# Patient Record
Sex: Female | Born: 1995 | Race: Black or African American | Hispanic: No | Marital: Single | Smoking: Current some day smoker
Health system: Southern US, Community
[De-identification: ages and names within clinical notes are randomized; demographics above are authoritative.]

## PROBLEM LIST (undated history)

## (undated) DIAGNOSIS — E663 Overweight: Secondary | ICD-10-CM

## (undated) DIAGNOSIS — J45909 Unspecified asthma, uncomplicated: Secondary | ICD-10-CM

## (undated) DIAGNOSIS — L509 Urticaria, unspecified: Secondary | ICD-10-CM

## (undated) HISTORY — DX: Overweight: E66.3

## (undated) HISTORY — DX: Urticaria, unspecified: L50.9

## (undated) HISTORY — DX: Unspecified asthma, uncomplicated: J45.909

## (undated) HISTORY — PX: ADENOIDECTOMY: SUR15

---

## 2017-02-02 ENCOUNTER — Encounter (HOSPITAL_COMMUNITY): Payer: Self-pay | Admitting: Emergency Medicine

## 2017-02-02 ENCOUNTER — Emergency Department (HOSPITAL_COMMUNITY)
Admission: EM | Admit: 2017-02-02 | Discharge: 2017-02-02 | Disposition: A | Discharge status: Home / Self Care | Payer: No Typology Code available for payment source | Attending: Emergency Medicine | Admitting: Emergency Medicine

## 2017-02-02 DIAGNOSIS — K5900 Constipation, unspecified: Secondary | ICD-10-CM | POA: Insufficient documentation

## 2017-02-02 LAB — URINALYSIS, ROUTINE W REFLEX MICROSCOPIC
BILIRUBIN URINE: NEGATIVE
Bacteria, UA: NONE SEEN
GLUCOSE, UA: NEGATIVE mg/dL
Hgb urine dipstick: NEGATIVE
KETONES UR: 5 mg/dL — AB
Leukocytes, UA: NEGATIVE
NITRITE: NEGATIVE
PH: 6 (ref 5.0–8.0)
Protein, ur: NEGATIVE mg/dL
SPECIFIC GRAVITY, URINE: 1.012 (ref 1.005–1.030)

## 2017-02-02 LAB — POC URINE PREG, ED: Preg Test, Ur: NEGATIVE

## 2017-02-02 MED ORDER — SORBITOL 70 % SOLN
960.0000 mL | TOPICAL_OIL | Freq: Once | ORAL | Status: AC
  Administered 2017-02-02: 960 mL via RECTAL
  Filled 2017-02-02: qty 240

## 2017-02-02 MED ORDER — ONDANSETRON 4 MG PO TBDP
4.0000 mg | ORAL_TABLET | Freq: Once | ORAL | Status: AC
  Administered 2017-02-02: 4 mg via ORAL
  Filled 2017-02-02: qty 1

## 2017-02-02 MED ORDER — DICYCLOMINE HCL 10 MG PO CAPS
20.0000 mg | ORAL_CAPSULE | Freq: Once | ORAL | Status: AC
Start: 2017-02-02 — End: 2017-02-02
  Administered 2017-02-02: 20 mg via ORAL
  Filled 2017-02-02: qty 2

## 2017-02-02 NOTE — ED Notes (Signed)
Pt reports that she has not had a bowel movement since Friday 01/27/17. Pt reports a couple of small bowel movements but nothing significant. Pt also reports intermittent lower abd pains.

## 2017-02-02 NOTE — Discharge Instructions (Signed)
I recommend that you increase your water and fiber intake. If you are not able to eat foods high in fiber, you may use Benefiber or Metamucil over-the-counter. I also recommend you use MiraLAX 1-2 times a day and Colace 100 mg twice a day to help with bowel movements. These medications are over the counter.  You may use other over-the-counter medications such as Dulcolax, Fleet enemas, magnesium citrate as needed for constipation. Please note that some of these medications may cause you to have abdominal cramping which is normal. If you develop severe abdominal pain, fever, vomiting, distention of your abdomen, unable to have a bowel movement for 5 days or are not passing gas, please return to the hospital. ° °

## 2017-02-02 NOTE — ED Provider Notes (Signed)
By signing my name below, I, Vista Mink, attest that this documentation has been prepared under the direction and in the presence of Haynes Giannotti, Layla Maw, DO. Electronically signed, Vista Mink, ED Scribe. 02/02/17. 4:15 AM.  TIME SEEN: 3:17 AM  CHIEF COMPLAINT: Constipation  HPI:  HPI Comments: Shelby Burke is a 21 y.o. female who presents to the Emergency Department complaining of persistent constipation that started today around 1300. Pt has felt like she's needed to have a bowel movement since this afternoon. When attempting to do so she states that she would start shaking and sweating. She also reports associated mild, intermittent lower abdominal cramping when trying to have a BM. Pt further reports intermittent nausea but no vomiting. Her last BM was six days ago. She is still having flatulence. Pt used Gentle women's laxative from [employer] once yesterday. No other medications tried prior to arrival. LNMP last week. No Hx of constipation. No Hx of abdominal surgeries. No abnormal vaginal discharge, bleeding. No dysuria, hematuria. No vomiting. No fever. Is having rectal pain but no rectal bleeding.   ROS: See HPI Constitutional: no fever  Eyes: no drainage  ENT: no runny nose   Cardiovascular:  no chest pain  Resp: no SOB  GI: no vomiting GU: no dysuria Integumentary: no rash  Allergy: no hives  Musculoskeletal: no leg swelling  Neurological: no slurred speech ROS otherwise negative  PAST MEDICAL HISTORY/PAST SURGICAL HISTORY:  History reviewed. No pertinent past medical history.  MEDICATIONS:  Prior to Admission medications   Not on File    ALLERGIES:  Not on File  SOCIAL HISTORY:  Social History  Substance Use Topics  . Smoking status: Never Smoker  . Smokeless tobacco: Never Used  . Alcohol use No    FAMILY HISTORY: History reviewed. No pertinent family history.  EXAM: BP (!) 132/97 (BP Location: Right Arm)   Pulse 63   Temp 98 F (36.7 C) (Oral)   Resp 18    Ht 5\' 4"  (1.626 m)   Wt 180 lb (81.6 kg)   LMP 01/15/2017   SpO2 100%   BMI 30.90 kg/m  CONSTITUTIONAL: Alert and oriented and responds appropriately to questions. Well-appearing; well-nourished HEAD: Normocephalic EYES: Conjunctivae clear, pupils appear equal, EOMI ENT: normal nose; moist mucous membranes NECK: Supple, no meningismus, no nuchal rigidity, no LAD  CARD: RRR; S1 and S2 appreciated; no murmurs, no clicks, no rubs, no gallops RESP: Normal chest excursion without splinting or tachypnea; breath sounds clear and equal bilaterally; no wheezes, no rhonchi, no rales, no hypoxia or respiratory distress, speaking full sentences ABD/GI: Normal bowel sounds; non-distended; soft, non-tender, no rebound, no guarding, no peritoneal signs, no hepatosplenomegaly RECTAL:  Normal rectal tone, no gross blood or melena, no hemorrhoids appreciated, nontender rectal exam, patient has hard stool in the rectal vault BACK:  The back appears normal and is non-tender to palpation, there is no CVA tenderness EXT: Normal ROM in all joints; non-tender to palpation; no edema; normal capillary refill; no cyanosis, no calf tenderness or swelling    SKIN: Normal color for age and race; warm; no rash NEURO: Moves all extremities equally PSYCH: The patient's mood and manner are appropriate. Grooming and personal hygiene are appropriate.  MEDICAL DECISION MAKING: Patient here with constipation since Friday. Abdominal exam benign. Doubt bowel obstruction. We'll give her an enema. No rectal bleeding on exam. No vomiting. She is otherwise well-appearing. We'll treat symptomatically with Bentyl and Zofran. Will obtain urine pregnancy test.  ED PROGRESS: 5:30 AM  Pt has been able to have a large bowel movement and reports feeling better. Urine and urine pregnancy test pending.  7:00 AM  Pt's urine shows no sign of infection and pregnancy test is negative. She reports complete relief in symptoms. Has been able to  have multiple large bowel movements. No vomiting here. Abdominal exam is still benign. No rectal bleeding. I feel she is safe to be discharged home. Recommend increased water and fiber intake at home. Given instructions on what to do at home if she has return of constipation. Discussed return precautions.   At this time, I do not feel there is any life-threatening condition present. I have reviewed and discussed all results (EKG, imaging, lab, urine as appropriate) and exam findings with patient/family. I have reviewed nursing notes and appropriate previous records.  I feel the patient is safe to be discharged home without further emergent workup and can continue workup as an outpatient as needed. Discussed usual and customary return precautions. Patient/family verbalize understanding and are comfortable with this plan.  Outpatient follow-up has been provided if needed. All questions have been answered.  This chart was scribed in my presence and reviewed by me personally.    Corlene Sabia, Layla MawKristen N, DO 02/02/17 0700

## 2017-02-02 NOTE — ED Triage Notes (Signed)
Pt presents to ED for assessment of constipation since 1pm today.  Pt states she has been pushing very hard and is now experiencing severe rectal pain.  Denies bleeding.

## 2017-07-18 HISTORY — PX: BREAST REDUCTION SURGERY: SHX8

## 2018-06-12 ENCOUNTER — Encounter: Payer: Self-pay | Admitting: Emergency Medicine

## 2018-06-12 ENCOUNTER — Emergency Department (HOSPITAL_COMMUNITY)
Admission: EM | Admit: 2018-06-12 | Discharge: 2018-06-12 | Disposition: A | Discharge status: Home / Self Care | Payer: No Typology Code available for payment source | Attending: Emergency Medicine | Admitting: Emergency Medicine

## 2018-06-12 DIAGNOSIS — J0100 Acute maxillary sinusitis, unspecified: Secondary | ICD-10-CM

## 2018-06-12 DIAGNOSIS — M542 Cervicalgia: Secondary | ICD-10-CM | POA: Diagnosis not present

## 2018-06-12 DIAGNOSIS — J019 Acute sinusitis, unspecified: Secondary | ICD-10-CM | POA: Insufficient documentation

## 2018-06-12 MED ORDER — AMOXICILLIN-POT CLAVULANATE 875-125 MG PO TABS
1.0000 | ORAL_TABLET | Freq: Two times a day (BID) | ORAL | 0 refills | Status: DC
Start: 2018-06-12 — End: 2018-06-19

## 2018-06-12 NOTE — ED Triage Notes (Signed)
Pt presents for evaluation of 2 day hx of headache, sore throat and cough. Pt reports some neck pain and chills.

## 2018-06-12 NOTE — ED Provider Notes (Signed)
[facility] EMERGENCY DEPARTMENT Provider Note   CSN: [id number] Arrival date & time: 06/12/18  1310     History   Chief Complaint Chief Complaint  Patient presents with  . Neck Pain  . Cough    HPI Parthena Fergeson is a 22 y.o. female.  22 y.o female with no PMH presents to the ED with a chief complaint of headache, neck pain x 2 days. Patient reports having some head pain, sore throat and congestion over the weekend and now worsening to neck pain. She is currently a Consulting civil engineer at [employer] and does not live in a dorm but in an apartment with two roommates.  Patient reports seen [facility] the student [facility] center this morning and had a strep test, influenza test, flu test and they were all negative.  She was sent in by student health center for suspicion of meningitis.  She does report she has had a cough over the weekend which she has taken some Mucinex with relieving symptoms.  Reports headache mainly located on the left side and frontal region along with occipital region and some photophobia. Denies any nausea, vomiting, abdominal pain, chest pain or shortness of breath.     History reviewed. No pertinent past medical history.  There are no active problems to display for this patient.   History reviewed. No pertinent surgical history.   OB History   None      Home Medications    Prior to Admission medications   Medication Sig Start Date End Date Taking? Authorizing Provider  amoxicillin-clavulanate (AUGMENTIN) 875-125 MG tablet Take 1 tablet by mouth 2 (two) times daily for 7 days. 06/12/18 06/19/18  Claude Manges, PA-C    Family History No family history on file.  Social History Social History   Tobacco Use  . Smoking status: Never Smoker  . Smokeless tobacco: Never Used  Substance Use Topics  . Alcohol use: No  . Drug use: No     Allergies   Patient has no known allergies.   Review of Systems Review of Systems    Constitutional: Negative for fever.  HENT: Positive for rhinorrhea and sore throat.   Respiratory: Positive for cough.   Cardiovascular: Negative for chest pain.  Gastrointestinal: Negative for abdominal pain.  Genitourinary: Negative for dysuria and flank pain.  Musculoskeletal: Positive for neck pain.  Skin: Negative for pallor and wound.  Neurological: Positive for headaches.     Physical Exam Updated Vital Signs BP 130/88   Pulse 81   Temp 98.9 F (37.2 C) (Oral)   Resp 16   LMP 06/12/2018 (Exact Date)   SpO2 98%   Physical Exam  Constitutional: She appears well-developed and well-nourished.  HENT:  Head: Normocephalic and atraumatic.  Nose: Right sinus exhibits maxillary sinus tenderness. Right sinus exhibits no frontal sinus tenderness. Left sinus exhibits maxillary sinus tenderness and frontal sinus tenderness.  Neck: Normal range of motion. Neck supple. Spinous process tenderness and muscular tenderness present. No neck rigidity. Normal range of motion present. No Brudzinski's sign and no Kernig's sign noted.  Nursing note and vitals reviewed.    ED Treatments / Results  Labs (all labs ordered are listed, but only abnormal results are displayed) Labs Reviewed - No data to display  EKG None  Radiology No results found.  Procedures Procedures (including critical care time)  Medications Ordered in ED Medications - No data to display   Initial Impression / Assessment and Plan / ED Course  I have reviewed the triage vital signs and the nursing notes.  Pertinent labs & imaging results that were available during my care of the patient were reviewed by me and considered in my medical decision making (see chart for details).   Patient presents with neck pain and headache x 2 days. Afebrile with normal vital signs during evaluation playing on her cellphone. [facility] and had strep, flu, mono test performed which were all  negative. Sent here to r/o "meningitis with CT imaging".  Relation patient has no neck rigidity but does complaint of headache along with neck pain, her neurologic exam is reassuring. Low suspicion for meningitis, patient is well appearing.  Patient does have tenderness along the left maxillary and frontal sinus, she is likely suffering from sinusitis.  We will send patient home on prescription for antibiotics.  According to patient goal was told that she did not have her last angitis vaccine, patient states that she is had her vaccines are up-to-date. Return precautions provided.   Final Clinical Impressions(s) / ED Diagnoses   Final diagnoses:  Neck pain  Acute maxillary sinusitis, recurrence not specified    ED Discharge Orders         Ordered    amoxicillin-clavulanate (AUGMENTIN) 875-125 MG tablet  2 times daily     06/12/18 1428           Claude MangesSoto, Illyana Schorsch, PA-C 06/12/18 1436    Melene PlanFloyd, Dan, DO 06/12/18 1439

## 2018-06-12 NOTE — Discharge Instructions (Addendum)
I have prescribed antibiotics to treat your sinus infection, please take 1 tablet twice a day for the next 7 days. Follow up with your primary care physician as needed.

## 2018-06-19 ENCOUNTER — Emergency Department (HOSPITAL_COMMUNITY)
Admission: EM | Admit: 2018-06-19 | Discharge: 2018-06-19 | Disposition: A | Discharge status: Home / Self Care | Payer: No Typology Code available for payment source | Attending: Emergency Medicine | Admitting: Emergency Medicine

## 2018-06-19 ENCOUNTER — Emergency Department (HOSPITAL_COMMUNITY): Payer: No Typology Code available for payment source

## 2018-06-19 DIAGNOSIS — J0100 Acute maxillary sinusitis, unspecified: Secondary | ICD-10-CM | POA: Diagnosis not present

## 2018-06-19 DIAGNOSIS — R51 Headache: Secondary | ICD-10-CM | POA: Diagnosis present

## 2018-06-19 LAB — PREGNANCY, URINE: PREG TEST UR: NEGATIVE

## 2018-06-19 MED ORDER — FLUTICASONE PROPIONATE 50 MCG/ACT NA SUSP: 1.0000 | Freq: Every day | NASAL | 2 refills | Status: AC

## 2018-06-19 MED ORDER — DEXAMETHASONE SODIUM PHOSPHATE 10 MG/ML IJ SOLN
10.0000 mg | Freq: Once | INTRAMUSCULAR | Status: AC
  Administered 2018-06-19: 10 mg via INTRAMUSCULAR
  Filled 2018-06-19: qty 1

## 2018-06-19 MED ORDER — KETOROLAC TROMETHAMINE 30 MG/ML IJ SOLN
30.0000 mg | Freq: Once | INTRAMUSCULAR | Status: AC
  Administered 2018-06-19: 30 mg via INTRAMUSCULAR
  Filled 2018-06-19: qty 1

## 2018-06-19 MED ORDER — KETOROLAC TROMETHAMINE 30 MG/ML IJ SOLN: 30.0000 mg | Freq: Once | INTRAMUSCULAR | Status: DC

## 2018-06-19 MED ORDER — DOXYCYCLINE HYCLATE 100 MG PO CAPS: 100.0000 mg | ORAL_CAPSULE | Freq: Two times a day (BID) | ORAL | 0 refills | Status: DC

## 2018-06-19 NOTE — ED Notes (Signed)
Patient transported to CT 

## 2018-06-19 NOTE — ED Triage Notes (Signed)
Pt reports headache intermittently X2 weeks. Was seen for same and diagnosed with sinusitis. She has had persistent symptoms and was sent by student health center for further evaluation.

## 2018-06-19 NOTE — ED Provider Notes (Signed)
[facility] Provider Note   CSN: [id number] Arrival date & time: 06/19/18  1341     History   Chief Complaint Chief Complaint  Patient presents with  . Headache    HPI Shelby Burke is a 22 y.o. female.  22 year old female presents with complaint of headache.  Patient states that she was seen in the ER 1 week ago for headache and neck pain, sent by student health, diagnosed with sinusitis in the ER and treated with Augmentin.  Patient has completed the Augmentin, reports she continues to have a frontal and left temporal headache.  Pain is constant, nothing makes pain better, nothing makes pain any worse, denies relief with Motrin and Tylenol at home.  Patient reports mild cough, denies congestion, fevers, neck pain or stiffness.  Patient is otherwise healthy, immunizations are up-to-date, has history of previous headaches like this in the past.  No other complaints or concerns.     No past medical history on file.  There are no active problems to display for this patient.   No past surgical history on file.   OB History   None      Home Medications    Prior to Admission medications   Medication Sig Start Date End Date Taking? Authorizing Provider  doxycycline (VIBRAMYCIN) 100 MG capsule Take 1 capsule (100 mg total) by mouth 2 (two) times daily. 06/19/18   Jeannie FendMurphy,  A, PA-C  fluticasone (FLONASE) 50 MCG/ACT nasal spray Place 1 spray into both nostrils daily. 06/19/18   Jeannie FendMurphy,  A, PA-C    Family History No family history on file.  Social History Social History   Tobacco Use  . Smoking status: Never Smoker  . Smokeless tobacco: Never Used  Substance Use Topics  . Alcohol use: No  . Drug use: No     Allergies   Patient has no known allergies.   Review of Systems Review of Systems  Constitutional: Negative for chills and fever.  HENT: Negative for congestion, rhinorrhea, sinus pressure, sinus pain,  sneezing and sore throat.   Eyes: Negative for photophobia and visual disturbance.  Respiratory: Positive for cough.   Gastrointestinal: Negative for nausea and vomiting.  Musculoskeletal: Negative for neck pain and neck stiffness.  Skin: Negative for rash and wound.  Allergic/Immunologic: Negative for immunocompromised state.  Neurological: Positive for headaches. Negative for dizziness, facial asymmetry, speech difficulty and weakness.  Hematological: Negative for adenopathy.  Psychiatric/Behavioral: Negative for confusion.  All other systems reviewed and are negative.    Physical Exam Updated Vital Signs BP 118/81 (BP Location: Right Arm)   Pulse 68   Temp 98.9 F (37.2 C) (Oral)   Resp 16   LMP 06/12/2018 (Exact Date)   SpO2 100%   Physical Exam  Constitutional: She is oriented to person, place, and time. She appears well-developed and well-nourished.  Non-toxic appearance. She does not appear ill. No distress.  HENT:  Head: Normocephalic and atraumatic.  Mouth/Throat: Oropharynx is clear and moist.  Eyes: Pupils are equal, round, and reactive to light. EOM are normal.  Neck: Normal range of motion. Neck supple. No neck rigidity. No Brudzinski's sign and no Kernig's sign noted.  Cardiovascular: Normal rate, regular rhythm, normal heart sounds and intact distal pulses.  Pulmonary/Chest: Effort normal and breath sounds normal. No respiratory distress.  Musculoskeletal: She exhibits tenderness.       Back:  Lymphadenopathy:    She has no cervical adenopathy.  Neurological: She is alert and oriented  to person, place, and time. She has normal strength. She displays normal reflexes. No cranial nerve deficit or sensory deficit. GCS eye subscore is 4. GCS verbal subscore is 5. GCS motor subscore is 6.  Skin: Skin is warm and dry. She is not diaphoretic.  Psychiatric: She has a normal mood and affect. Her behavior is normal.  Nursing note and vitals reviewed.    ED Treatments  / Results  Labs (all labs ordered are listed, but only abnormal results are displayed) Labs Reviewed  PREGNANCY, URINE    EKG None  Radiology Ct Head Wo Contrast  Result Date: 06/19/2018 CLINICAL DATA:  Headache. EXAM: CT HEAD WITHOUT CONTRAST TECHNIQUE: Contiguous axial images were obtained from the base of the skull through the vertex without intravenous contrast. COMPARISON:  None. FINDINGS: Brain: No evidence of acute infarction, hemorrhage, hydrocephalus, extra-axial collection or mass lesion/mass effect. Vascular: No hyperdense vessel or unexpected calcification. Skull: Normal. Negative for fracture or focal lesion. Sinuses/Orbits: Mucosal thickening in the right greater than left maxillary sinus. Opacification of the left sphenoid sinus. Other: None. IMPRESSION: 1. Sinus disease as above. 2. No acute intracranial abnormalities. Electronically Signed   By: Gerome Sam III M.D   On: 06/19/2018 16:23    Procedures Procedures (including critical care time)  Medications Ordered in ED Medications  dexamethasone (DECADRON) injection 10 mg (has no administration in time range)  ketorolac (TORADOL) 30 MG/ML injection 30 mg (has no administration in time range)     Initial Impression / Assessment and Plan / ED Course  I have reviewed the triage vital signs and the nursing notes.  Pertinent labs & imaging results that were available during my care of the patient were reviewed by me and considered in my medical decision making (see chart for details).  Clinical Course as of Jun 20 1647  Tue Jun 19, 2018  576 22 year old female returns to the ER with ongoing headache.  Patient was in the emergency room 1 week ago and diagnosed with acute sinusitis, treated with 7 days of Augmentin.  Patient has completed the course of antibiotics, continues to have headaches.  Exam is unremarkable, neuro exam normal.  CT head for headaches in a patient with no prior history of headaches shows  sinusitis.  Patient will be started on doxycycline, she has completed her Augmentin.  Also recommend Flonase, Zyrtec, Sudafed, saline sinus rinse.  Patient lives in [place], she is attending school locally.  Recommend patient follow-up with her PCP, referral to ENT if not improving.  Discharge planning discussed with patient and with her parents with patient's permission, patient and parents verbalized understanding of discharge instructions and plan.   [LM]    Clinical Course User Index [LM] Jeannie Fend, PA-C   Final Clinical Impressions(s) / ED Diagnoses   Final diagnoses:  Acute non-recurrent maxillary sinusitis    ED Discharge Orders         Ordered    doxycycline (VIBRAMYCIN) 100 MG capsule  2 times daily     06/19/18 1636    fluticasone (FLONASE) 50 MCG/ACT nasal spray  Daily     06/19/18 1636           Jeannie Fend, PA-C 06/19/18 1648    Curatolo, Adam, DO 06/19/18 1830

## 2018-06-19 NOTE — ED Notes (Signed)
Patient verbalizes understanding of discharge instructions. Opportunity for questioning and answers were provided. Armband removed by staff, pt discharged from ED ambulatory.   

## 2018-06-19 NOTE — Discharge Instructions (Signed)
Take doxycycline as prescribed and complete the full course. Use Flonase daily as prescribed.   Use saline sinus rinse twice daily. Take Zyrtec daily as directed. Take Sudafed daily (ask the pharmacist for this medication, this is NOT the same Sudafed that is available over the counter).  Follow up with PCP for recheck, referral to ENT if not improving.

## 2018-06-19 NOTE — ED Notes (Signed)
Got patient into a gown on the monitor patient is resting with call ell in reach

## 2019-03-16 ENCOUNTER — Other Ambulatory Visit: Payer: Self-pay

## 2019-03-16 DIAGNOSIS — Z20822 Contact with and (suspected) exposure to covid-19: Secondary | ICD-10-CM

## 2019-03-17 LAB — NOVEL CORONAVIRUS, NAA: SARS-CoV-2, NAA: NOT DETECTED

## 2019-07-29 ENCOUNTER — Telehealth: Payer: Self-pay

## 2019-07-29 NOTE — Telephone Encounter (Signed)
Received forms for patient to transfer her allergy injections from [facility] to our office. Patient has not been seen in our office. Called and spoke with patient in regards to this matter. Patient stated that she is wanting to transfer her injections to our office and she was not sure how to go about doing that. Informed patient that she would need to be seen by one of our providers before we can agree to administer her injections.Patient scheduled an appt for 07/31/2019 at 2 pm with Dr. Selena Batten. Forms will be signed and faxed back during that time.

## 2019-07-31 ENCOUNTER — Encounter: Payer: Self-pay | Admitting: Allergy

## 2019-07-31 ENCOUNTER — Ambulatory Visit: Payer: No Typology Code available for payment source | Admitting: Allergy

## 2019-07-31 ENCOUNTER — Other Ambulatory Visit: Payer: Self-pay

## 2019-07-31 VITALS — BP 122/80 | HR 66 | Temp 97.2°F | Resp 18 | Ht 64.5 in | Wt 193.6 lb

## 2019-07-31 DIAGNOSIS — J3089 Other allergic rhinitis: Secondary | ICD-10-CM

## 2019-07-31 DIAGNOSIS — J329 Chronic sinusitis, unspecified: Secondary | ICD-10-CM

## 2019-07-31 DIAGNOSIS — J4520 Mild intermittent asthma, uncomplicated: Secondary | ICD-10-CM | POA: Diagnosis not present

## 2019-07-31 DIAGNOSIS — L282 Other prurigo: Secondary | ICD-10-CM | POA: Diagnosis not present

## 2019-07-31 MED ORDER — EPINEPHRINE 0.3 MG/0.3ML IJ SOAJ: 0.3000 mg | Freq: Once | INTRAMUSCULAR | 1 refills | Status: AC

## 2019-07-31 NOTE — Progress Notes (Signed)
New Patient Note  RE: Golda Zavalza MRN: [id number] DOB: Jan 01, 1996 Date of Office Visit: 07/31/2019  Referring provider: Physicians, Neil Crouch* Primary care provider: Physicians, [facility]  Chief Complaint: Allergic Rhinitis  and Urticaria  History of Present Illness: I had the pleasure of seeing Tasheika Kitzmiller for initial evaluation at the [facility] on 07/31/2019. She is a 24 y.o. female, who is self-referred here for the evaluation of transfer of allergy injections.   Rhinitis: She reports symptoms of sneezing, nasal congestion, rhinorrhea, frequent sinus infections. Symptoms have been going on for 20+ years. The symptoms are present all year around with worsening in spring and winter. Other triggers include exposure to pets. Anosmia: no. Headache: sometimes. She has used Xyzal, Flonase 2 sprays per nostril BID with some improvement in symptoms. No nosebleeds. Sinus infections: yes - about 5. Previous work up includes: 07/16/2019 which was positive to cat, dog, cockroaches, tree, grass, mold per patient report. Patient is from [place] area but goes to school in [place]. The office in [place] made the vial and will ship to [place]. Has not had her first injection yet.  Previous ENT evaluation: adenoidectomy in 2011. Previous sinus imaging: no. History of nasal polyps: no.  Last eye exam: in 2020. History of reflux: no.  Rash: Rash started about 4 years ago. Mainly occurs on her arms and legs. Describes them as itchy, red, blotchiness. Individual rashes lasts about a few hours. No ecchymosis upon resolution. Associated symptoms include: none. Suspected triggers are unknown. Denies any fevers, chills, changes in medications, foods, personal care products or recent infections. She has tried the following therapies: Xyzal BID with no benefit. Systemic steroids - Currently on prednisone taper and doing well on it.  Previous work up includes: had some type of  bloodwork. Previous history of rash/hives: no.  Reviewed images on the phone - faint erythematous patch on upper extremities.   Asthma: She reports symptoms of chest tightness, shortness of breath, coughing, wheezing for 15 years. Current medications include albuterol prn which help. She reports not using aerochamber with inhalers. She tried the following inhalers: none. Main triggers are exertion, pollen. In the last month, frequency of symptoms: <1x/week. Frequency of nocturnal symptoms: 0x/month. Frequency of SABA use: <1x/week. Interference with physical activity: sometimes. Sleep is undisturbed. In the last 12 months, emergency room visits/urgent care visits/doctor office visits or hospitalizations due to respiratory issues: 0. In the last 12 months, oral steroids courses: 0. Lifetime history of hospitalization for respiratory issues: no. Prior intubations: no. Asthma was diagnosed at age 45. History of pneumonia: age 7. She was evaluated by allergist in the past. Smoking exposure: no. Up to date with flu vaccine: yes.   Assessment and Plan: Sharalee is a 24 y.o. female with: Other allergic rhinitis Perennial rhinitis symptoms for 20+ years with worsening in the spring and winter. Currently on Xyzal and Flonase with some benefit. Skin testing on 07/16/2019 at outside facility was positive to cat, dog, cockroaches, tree, grass, mold per patient report - in process of obtaining records. Patient goes to school in [place] and would like to receive AIT in our office but use her previous allergist's vials.   Will review results/notes from your previous allergist.  Please call before your first injection to make sure your vials are here.   Discussed in detail regarding the dosing, schedule, side effects (mild to moderate local allergic reaction and rarely systemic allergic reactions including anaphylaxis), and benefits (significant improvement in nasal  symptoms, seasonal flares of asthma) of immunotherapy  with the patient. There is significant time commitment involved with allergy shots.   I have prescribed epinephrine injectable and demonstrated proper use. For mild symptoms you can take over the counter antihistamines such as Benadryl and monitor symptoms closely. If symptoms worsen or if you have severe symptoms including breathing issues, throat closure, significant swelling, whole body hives, severe diarrhea and vomiting, lightheadedness then inject epinephrine and seek immediate medical care afterwards.  Consent was signed.   Continue environmental control measures.  Continue Xyzal 5mg  daily at night.  Continue Flonase 2 sprays per nostril once a day. Continue Singulair (montelukast) 10mg  daily at night. Keep track of sinus infections.   Frequent sinus infections 5 sinus infections this year. Apparently she had immune evaluation as well - in process of obtaining records.  Keep track of infections.   Pruritic rash Pruritic rash on extremities for the past 4 years. Worse after showers sometimes. Takes Xyzal 5mg  BID with minimal benefit. Currently on a prednisone taper. Apparently she had bloodwork for this as well - in process of obtaining records.   Finish prednisone taper as prescribed by your other allergist.   See below for proper skin care.  Mild intermittent asthma without complication Diagnosed with asthma over 15 years ago. Using albuterol less than once a month. Main triggers are exertion.   May use albuterol rescue inhaler 2 puffs every 4 to 6 hours as needed for shortness of breath, chest tightness, coughing, and wheezing. May use albuterol rescue inhaler 2 puffs 5 to 15 minutes prior to strenuous physical activities. Monitor frequency of use.   Return in about 6 months (around 01/28/2020).  Meds ordered this encounter  Medications  . EPINEPHrine 0.3 mg/0.3 mL IJ SOAJ injection    Sig: Inject 0.3 mLs (0.3 mg total) into the muscle once for 1 dose.    Dispense:  2  each    Refill:  1    Generic Mylan Brand   Other allergy screening: Food allergy: no Medication allergy: no Hymenoptera allergy: no Eczema:no History of recurrent infections suggestive of immunodeficency: no  Diagnostics: None.   Past Medical History: Patient Active Problem List   Diagnosis Date Noted  . Other allergic rhinitis 07/31/2019  . Frequent sinus infections 07/31/2019  . Pruritic rash 07/31/2019  . Mild intermittent asthma without complication 07/31/2019   Past Medical History:  Diagnosis Date  . Asthma   . Urticaria    Past Surgical History: Past Surgical History:  Procedure Laterality Date  . ADENOIDECTOMY    . BREAST REDUCTION SURGERY  2019   Medication List:  Current Outpatient Medications  Medication Sig Dispense Refill  . albuterol (VENTOLIN HFA) 108 (90 Base) MCG/ACT inhaler SMARTSIG:2 Puff(s) By Mouth Every 4-6 Hours PRN    . fluticasone (FLONASE) 50 MCG/ACT nasal spray Place 1 spray into both nostrils daily. 16 g 2  . levocetirizine (XYZAL) 5 MG tablet Take 5 mg by mouth daily.    . montelukast (SINGULAIR) 10 MG tablet Take 10 mg by mouth daily.    . predniSONE (DELTASONE) 10 MG tablet TAKE 6 TABLETS DAILY X 3 DAYS THEN DECREASE BY 1 TABLET EVERY 3 DAYS UNTIL FINISHED    . TRI-LO-MARZIA 0.18/0.215/0.25 MG-25 MCG tab Take 1 tablet by mouth daily.    08/02/2019 doxycycline (VIBRAMYCIN) 100 MG capsule Take 1 capsule (100 mg total) by mouth 2 (two) times daily. (Patient not taking: Reported on 07/31/2019) 20 capsule 0  . EPINEPHrine 0.3  mg/0.3 mL IJ SOAJ injection Inject 0.3 mLs (0.3 mg total) into the muscle once for 1 dose. 2 each 1   No current facility-administered medications for this visit.   Allergies: No Known Allergies Social History: Social History   Socioeconomic History  . Marital status: Single    Spouse name: Not on file  . Number of children: Not on file  . Years of education: Not on file  . Highest education level: Not on file   Occupational History  . Not on file  Tobacco Use  . Smoking status: Never Smoker  . Smokeless tobacco: Never Used  Substance and Sexual Activity  . Alcohol use: Yes    Comment: once or twice a month  . Drug use: No  . Sexual activity: Not on file  Other Topics Concern  . Not on file  Social History Narrative  . Not on file   Social Determinants of Health   Financial Resource Strain:   . Difficulty of Paying Living Expenses: Not on file  Food Insecurity:   . Worried About Programme researcher, broadcasting/film/video in the Last Year: Not on file  . Ran Out of Food in the Last Year: Not on file  Transportation Needs:   . Lack of Transportation (Medical): Not on file  . Lack of Transportation (Non-Medical): Not on file  Physical Activity:   . Days of Exercise per Week: Not on file  . Minutes of Exercise per Session: Not on file  Stress:   . Feeling of Stress : Not on file  Social Connections:   . Frequency of Communication with Friends and Family: Not on file  . Frequency of Social Gatherings with Friends and Family: Not on file  . Attends Religious Services: Not on file  . Active Member of Clubs or Organizations: Not on file  . Attends Banker Meetings: Not on file  . Marital Status: Not on file   Lives in an apartment. Smoking: denies Occupation: full time Press photographer HistorySurveyor, minerals in the house: no Carpet in the family room: no Carpet in the bedroom: no Heating: electric Cooling: central Pet: no  Family History: Family History  Problem Relation Age of Onset  . Asthma Mother   . Allergic rhinitis Mother   . Allergic rhinitis Father    Review of Systems  Constitutional: Negative for appetite change, chills, fever and unexpected weight change.  HENT: Positive for congestion, rhinorrhea and sneezing.   Eyes: Negative for itching.  Respiratory: Negative for cough, chest tightness, shortness of breath and wheezing.   Cardiovascular: Negative for  chest pain.  Gastrointestinal: Negative for abdominal pain.  Genitourinary: Negative for difficulty urinating.  Skin: Negative for rash.  Allergic/Immunologic: Positive for environmental allergies. Negative for food allergies.  Neurological: Negative for headaches.   Objective: BP 122/80 (BP Location: Left Arm, Patient Position: Sitting, Cuff Size: Normal)   Pulse 66   Temp (!) 97.2 F (36.2 C) (Temporal)   Resp 18   Ht 5' 4.5" (1.638 m)   Wt 193 lb 9.6 oz (87.8 kg)   SpO2 97%   BMI 32.72 kg/m  Body mass index is 32.72 kg/m. Physical Exam  Constitutional: She is oriented to person, place, and time. She appears well-developed and well-nourished.  HENT:  Head: Normocephalic and atraumatic.  Right Ear: External ear normal.  Left Ear: External ear normal.  Nose: Nose normal.  Mouth/Throat: Oropharynx is clear and moist.  Eyes: Conjunctivae and EOM are normal.  Cardiovascular:  Normal rate, regular rhythm and normal heart sounds. Exam reveals no gallop and no friction rub.  No murmur heard. Pulmonary/Chest: Effort normal and breath sounds normal. She has no wheezes. She has no rales.  Abdominal: Soft.  Musculoskeletal:     Cervical back: Neck supple.  Neurological: She is alert and oriented to person, place, and time.  Skin: Skin is warm. No rash noted.  Psychiatric: She has a normal mood and affect. Her behavior is normal.  Nursing note and vitals reviewed.  The plan was reviewed with the patient/family, and all questions/concerned were addressed.  It was my pleasure to see Rhenda today and participate in her care. Please feel free to contact me with any questions or concerns.  Sincerely,  Rexene Alberts, DO Allergy & Immunology  [facility] office: [phone] [facility] office: [place] office: [phone]

## 2019-07-31 NOTE — Assessment & Plan Note (Signed)
Pruritic rash on extremities for the past 4 years. Worse after showers sometimes. Takes Xyzal 5mg  BID with minimal benefit. Currently on a prednisone taper. Apparently she had bloodwork for this as well - in process of obtaining records.   Finish prednisone taper as prescribed by your other allergist.   See below for proper skin care.

## 2019-07-31 NOTE — Telephone Encounter (Signed)
Patient was seen in clinic today by Dr. Selena Batten. Dr. Selena Batten has signed the forms and is also requesting patients medical records, both have been faxed to [facility]. Waiting for medical records to be faxed and patient vials to be shipped to our [place] office.

## 2019-07-31 NOTE — Assessment & Plan Note (Signed)
5 sinus infections this year. Apparently she had immune evaluation as well - in process of obtaining records.  Keep track of infections.

## 2019-07-31 NOTE — Patient Instructions (Addendum)
Will get results/notes from your previous allergist.   Once, we have the vials from your previous allergist, we will administer them at our office.   Please call before your first injection to make sure your vials are here.   I have prescribed epinephrine injectable and demonstrated proper use. For mild symptoms you can take over the counter antihistamines such as Benadryl and monitor symptoms closely. If symptoms worsen or if you have severe symptoms including breathing issues, throat closure, significant swelling, whole body hives, severe diarrhea and vomiting, lightheadedness then inject epinephrine and seek immediate medical care afterwards.  Consent was signed.    Continue environmental control measures.  Continue xyzal 5mg  daily at night.  Continue Flonase 2 sprays per nostril once a day. Continue Singulair (montelukast) 10mg  daily at night. Keep track of sinus infections.   Asthma:  May use albuterol rescue inhaler 2 puffs every 4 to 6 hours as needed for shortness of breath, chest tightness, coughing, and wheezing. May use albuterol rescue inhaler 2 puffs 5 to 15 minutes prior to strenuous physical activities. Monitor frequency of use.   Rash:   Finish prednisone taper as prescribed by your other allergist.   See below for proper skin care.  Follow up in 6 months or sooner if needed.  Make 2 week appointment for injection.    Skin care recommendations  Bath time: . Always use lukewarm water. AVOID very hot or cold water. Keep bathing time to 5-10 minutes. . Do NOT use bubble bath. . Use a mild soap and use just enough to wash the dirty areas. . Do NOT scrub skin vigorously.  . After bathing, pat dry your skin with a towel. Do NOT rub or scrub the skin.  Moisturizers and prescriptions:  . ALWAYS apply moisturizers immediately after bathing (within 3 minutes). This helps to lock-in moisture. . Use the moisturizer several times a day over the whole body.  summer moisturizers include: Aveeno, CeraVe, Cetaphil. Marland Kitchen winter moisturizers include: Aquaphor, Vaseline, Cerave, Cetaphil, Eucerin, Vanicream. . When using moisturizers along with medications, the moisturizer should be applied about one hour after applying the medication to prevent diluting effect of the medication or moisturize around where you applied the medications. When not using medications, the moisturizer can be continued twice daily as maintenance.  Laundry and clothing: . Avoid laundry products with added color or perfumes. . Use unscented hypo-allergenic laundry products such as Tide free, Cheer free & gentle, and All free and clear.  . If the skin still seems dry or sensitive, you can try double-rinsing the clothes. . Avoid tight or scratchy clothing such as wool. . Do not use fabric softeners or dyer sheets.

## 2019-07-31 NOTE — Assessment & Plan Note (Signed)
Diagnosed with asthma over 15 years ago. Using albuterol less than once a month. Main triggers are exertion.   May use albuterol rescue inhaler 2 puffs every 4 to 6 hours as needed for shortness of breath, chest tightness, coughing, and wheezing. May use albuterol rescue inhaler 2 puffs 5 to 15 minutes prior to strenuous physical activities. Monitor frequency of use.

## 2019-07-31 NOTE — Assessment & Plan Note (Addendum)
Perennial rhinitis symptoms for 20+ years with worsening in the spring and winter. Currently on Xyzal and Flonase with some benefit. Skin testing on 07/16/2019 at outside facility was positive to cat, dog, cockroaches, tree, grass, mold per patient report - in process of obtaining records. Patient goes to school in [place] and would like to receive AIT in our office but use her previous allergist's vials.   Will review results/notes from your previous allergist.  Please call before your first injection to make sure your vials are here.   Discussed in detail regarding the dosing, schedule, side effects (mild to moderate local allergic reaction and rarely systemic allergic reactions including anaphylaxis), and benefits (significant improvement in nasal symptoms, seasonal flares of asthma) of immunotherapy with the patient. There is significant time commitment involved with allergy shots.   I have prescribed epinephrine injectable and demonstrated proper use. For mild symptoms you can take over the counter antihistamines such as Benadryl and monitor symptoms closely. If symptoms worsen or if you have severe symptoms including breathing issues, throat closure, significant swelling, whole body hives, severe diarrhea and vomiting, lightheadedness then inject epinephrine and seek immediate medical care afterwards.  Consent was signed.   Continue environmental control measures.  Continue Xyzal 5mg  daily at night.  Continue Flonase 2 sprays per nostril once a day. Continue Singulair (montelukast) 10mg  daily at night. Keep track of sinus infections.

## 2019-08-08 NOTE — Telephone Encounter (Signed)
Received fax from [facility] stating patient's vials had been mailed and that we should receive them 08-09-2019.

## 2019-08-09 NOTE — Telephone Encounter (Signed)
Vials have been received in the [place] office.

## 2019-08-09 NOTE — Telephone Encounter (Signed)
Patient's silver vials are her first vials to start injections with and have been placed in the new start tray. The remainder of the patient's vials have been placed in the section for Outside Office vials. Patient's shot records and refill records have been placed in the outdside office records binder.

## 2019-08-15 ENCOUNTER — Telehealth: Payer: Self-pay

## 2019-08-15 NOTE — Telephone Encounter (Signed)
I spoke with [facility] and they are faxing records over to the [place] location and [facility] location

## 2019-08-15 NOTE — Telephone Encounter (Signed)
Records were received and given to Dr. Selena Batten for review.

## 2019-08-15 NOTE — Telephone Encounter (Signed)
-----   Message from Ellamae Sia, DO sent at 08/14/2019  1:28 PM EST ----- Regarding: FW: awaiting clt allergy notes and vials Can someone follow up on her previous allergist's records. Requested at last OV but did not receive yet.  Her vials are here but I need to review her previous allergist's notes.   Thank you.  ----- Message ----- From: Ellamae Sia, DO Sent: 07/31/2019   4:10 PM EST To: Ellamae Sia, DO Subject: awaiting clt allergy notes and vials

## 2019-08-16 NOTE — Telephone Encounter (Signed)
Reviewed notes from previous allergist's visit from 07/16/2019. Skin prick testing was positive to cat, dog, dust mites, cockroach, elder tree, ash tree, beech tree, birch tree, Box Elder, Cottonwood, American elm, red mulberry, oak mix, black walnut, [place] grass, [place], Johnson, grass mix #7, cocklebur, lambs quarter, mugwort, pigweed, [place] plantin, Engineer, agricultural, ragweed mix, Alternaria, aspergillus, Hormodendrum.  Records sent for scanning.

## 2019-08-21 ENCOUNTER — Other Ambulatory Visit: Payer: Self-pay

## 2019-08-21 ENCOUNTER — Ambulatory Visit (INDEPENDENT_AMBULATORY_CARE_PROVIDER_SITE_OTHER): Payer: No Typology Code available for payment source

## 2019-08-21 DIAGNOSIS — J309 Allergic rhinitis, unspecified: Secondary | ICD-10-CM | POA: Diagnosis not present

## 2019-08-21 NOTE — Progress Notes (Signed)
Immunotherapy   Patient Details  Name: Nasiya Pascual MRN: [id number] Date of Birth: 08/15/1995  08/21/2019  Lisbeth Renshaw started her allergy injections from another office today. Patient received 0.1 ml out of vial A and vial B both silver 1:10000. Patient waited 30 minutes with no problems.  Following schedule:  C Frequency: Weekly Epi-Pen: Yes Consent signed and patient instructions given.   Dub Mikes 08/21/2019, 9:59 AM

## 2019-08-28 ENCOUNTER — Ambulatory Visit (INDEPENDENT_AMBULATORY_CARE_PROVIDER_SITE_OTHER): Payer: No Typology Code available for payment source

## 2019-08-28 DIAGNOSIS — J309 Allergic rhinitis, unspecified: Secondary | ICD-10-CM

## 2019-09-04 ENCOUNTER — Ambulatory Visit (INDEPENDENT_AMBULATORY_CARE_PROVIDER_SITE_OTHER): Payer: No Typology Code available for payment source

## 2019-09-04 DIAGNOSIS — J309 Allergic rhinitis, unspecified: Secondary | ICD-10-CM | POA: Diagnosis not present

## 2019-09-11 ENCOUNTER — Ambulatory Visit (INDEPENDENT_AMBULATORY_CARE_PROVIDER_SITE_OTHER): Payer: No Typology Code available for payment source | Admitting: *Deleted

## 2019-09-11 DIAGNOSIS — J309 Allergic rhinitis, unspecified: Secondary | ICD-10-CM

## 2019-09-18 ENCOUNTER — Ambulatory Visit (INDEPENDENT_AMBULATORY_CARE_PROVIDER_SITE_OTHER): Payer: No Typology Code available for payment source

## 2019-09-18 DIAGNOSIS — J309 Allergic rhinitis, unspecified: Secondary | ICD-10-CM

## 2019-09-25 ENCOUNTER — Ambulatory Visit (INDEPENDENT_AMBULATORY_CARE_PROVIDER_SITE_OTHER): Payer: No Typology Code available for payment source | Admitting: *Deleted

## 2019-09-25 DIAGNOSIS — J309 Allergic rhinitis, unspecified: Secondary | ICD-10-CM

## 2019-10-03 ENCOUNTER — Ambulatory Visit (INDEPENDENT_AMBULATORY_CARE_PROVIDER_SITE_OTHER): Payer: No Typology Code available for payment source

## 2019-10-03 DIAGNOSIS — J309 Allergic rhinitis, unspecified: Secondary | ICD-10-CM

## 2019-10-09 ENCOUNTER — Ambulatory Visit (INDEPENDENT_AMBULATORY_CARE_PROVIDER_SITE_OTHER): Payer: No Typology Code available for payment source

## 2019-10-09 DIAGNOSIS — J309 Allergic rhinitis, unspecified: Secondary | ICD-10-CM | POA: Diagnosis not present

## 2019-10-16 ENCOUNTER — Ambulatory Visit (INDEPENDENT_AMBULATORY_CARE_PROVIDER_SITE_OTHER): Payer: No Typology Code available for payment source

## 2019-10-16 DIAGNOSIS — J309 Allergic rhinitis, unspecified: Secondary | ICD-10-CM

## 2019-10-23 ENCOUNTER — Ambulatory Visit (INDEPENDENT_AMBULATORY_CARE_PROVIDER_SITE_OTHER): Payer: No Typology Code available for payment source

## 2019-10-23 DIAGNOSIS — J309 Allergic rhinitis, unspecified: Secondary | ICD-10-CM

## 2019-10-30 ENCOUNTER — Ambulatory Visit (INDEPENDENT_AMBULATORY_CARE_PROVIDER_SITE_OTHER): Payer: No Typology Code available for payment source

## 2019-10-30 DIAGNOSIS — J309 Allergic rhinitis, unspecified: Secondary | ICD-10-CM

## 2019-11-06 ENCOUNTER — Ambulatory Visit (INDEPENDENT_AMBULATORY_CARE_PROVIDER_SITE_OTHER): Payer: No Typology Code available for payment source

## 2019-11-06 DIAGNOSIS — J309 Allergic rhinitis, unspecified: Secondary | ICD-10-CM | POA: Diagnosis not present

## 2019-11-13 ENCOUNTER — Ambulatory Visit (INDEPENDENT_AMBULATORY_CARE_PROVIDER_SITE_OTHER): Payer: No Typology Code available for payment source

## 2019-11-13 DIAGNOSIS — J309 Allergic rhinitis, unspecified: Secondary | ICD-10-CM

## 2019-11-22 ENCOUNTER — Ambulatory Visit (INDEPENDENT_AMBULATORY_CARE_PROVIDER_SITE_OTHER): Payer: No Typology Code available for payment source

## 2019-11-22 DIAGNOSIS — J309 Allergic rhinitis, unspecified: Secondary | ICD-10-CM

## 2019-11-29 ENCOUNTER — Ambulatory Visit (INDEPENDENT_AMBULATORY_CARE_PROVIDER_SITE_OTHER): Payer: No Typology Code available for payment source

## 2019-11-29 DIAGNOSIS — J309 Allergic rhinitis, unspecified: Secondary | ICD-10-CM | POA: Diagnosis not present

## 2019-12-05 ENCOUNTER — Ambulatory Visit (INDEPENDENT_AMBULATORY_CARE_PROVIDER_SITE_OTHER): Payer: No Typology Code available for payment source

## 2019-12-05 DIAGNOSIS — J309 Allergic rhinitis, unspecified: Secondary | ICD-10-CM | POA: Diagnosis not present

## 2019-12-13 ENCOUNTER — Ambulatory Visit (INDEPENDENT_AMBULATORY_CARE_PROVIDER_SITE_OTHER): Payer: No Typology Code available for payment source

## 2019-12-13 DIAGNOSIS — J309 Allergic rhinitis, unspecified: Secondary | ICD-10-CM

## 2019-12-20 ENCOUNTER — Ambulatory Visit (INDEPENDENT_AMBULATORY_CARE_PROVIDER_SITE_OTHER): Payer: No Typology Code available for payment source

## 2019-12-20 DIAGNOSIS — J309 Allergic rhinitis, unspecified: Secondary | ICD-10-CM | POA: Diagnosis not present

## 2019-12-31 ENCOUNTER — Ambulatory Visit (INDEPENDENT_AMBULATORY_CARE_PROVIDER_SITE_OTHER): Payer: No Typology Code available for payment source

## 2019-12-31 DIAGNOSIS — J309 Allergic rhinitis, unspecified: Secondary | ICD-10-CM | POA: Diagnosis not present

## 2020-01-09 ENCOUNTER — Ambulatory Visit (INDEPENDENT_AMBULATORY_CARE_PROVIDER_SITE_OTHER): Payer: No Typology Code available for payment source

## 2020-01-09 DIAGNOSIS — J309 Allergic rhinitis, unspecified: Secondary | ICD-10-CM | POA: Diagnosis not present

## 2020-01-21 ENCOUNTER — Ambulatory Visit (INDEPENDENT_AMBULATORY_CARE_PROVIDER_SITE_OTHER): Payer: No Typology Code available for payment source

## 2020-01-21 DIAGNOSIS — J309 Allergic rhinitis, unspecified: Secondary | ICD-10-CM | POA: Diagnosis not present

## 2020-01-29 ENCOUNTER — Emergency Department (HOSPITAL_COMMUNITY)
Admission: EM | Admit: 2020-01-29 | Discharge: 2020-01-29 | Disposition: A | Discharge status: Home / Self Care | Payer: No Typology Code available for payment source | Attending: Emergency Medicine | Admitting: Emergency Medicine

## 2020-01-29 ENCOUNTER — Encounter (HOSPITAL_COMMUNITY): Payer: Self-pay | Admitting: Emergency Medicine

## 2020-01-29 DIAGNOSIS — S61411A Laceration without foreign body of right hand, initial encounter: Secondary | ICD-10-CM | POA: Insufficient documentation

## 2020-01-29 DIAGNOSIS — J45909 Unspecified asthma, uncomplicated: Secondary | ICD-10-CM | POA: Insufficient documentation

## 2020-01-29 DIAGNOSIS — Y939 Activity, unspecified: Secondary | ICD-10-CM | POA: Diagnosis not present

## 2020-01-29 DIAGNOSIS — Y999 Unspecified external cause status: Secondary | ICD-10-CM | POA: Insufficient documentation

## 2020-01-29 DIAGNOSIS — Z23 Encounter for immunization: Secondary | ICD-10-CM | POA: Insufficient documentation

## 2020-01-29 DIAGNOSIS — Z79899 Other long term (current) drug therapy: Secondary | ICD-10-CM | POA: Diagnosis not present

## 2020-01-29 DIAGNOSIS — Y929 Unspecified place or not applicable: Secondary | ICD-10-CM | POA: Diagnosis not present

## 2020-01-29 DIAGNOSIS — W260XXA Contact with knife, initial encounter: Secondary | ICD-10-CM | POA: Diagnosis not present

## 2020-01-29 MED ORDER — TETANUS-DIPHTH-ACELL PERTUSSIS 5-2.5-18.5 LF-MCG/0.5 IM SUSP
0.5000 mL | Freq: Once | INTRAMUSCULAR | Status: AC
  Administered 2020-01-29: 09:00:00 0.5 mL via INTRAMUSCULAR
  Filled 2020-01-29: qty 0.5

## 2020-01-29 MED ORDER — CEPHALEXIN 500 MG PO CAPS: 500.0000 mg | ORAL_CAPSULE | Freq: Two times a day (BID) | ORAL | 0 refills | Status: AC

## 2020-01-29 NOTE — ED Notes (Signed)
ED Provider at bedside. 

## 2020-01-29 NOTE — Discharge Instructions (Signed)
Keep the laceration completely dry with a dressing in place for the next 24 hours.  Then may take a brief shower and clean gently with antibacterial soap and water, pat dry with a clean towel or gauze, apply topical bacitracin and a dressing.  Repeat this daily until suture removal in 8 to 10 days.  Call your PCP to schedule appointment for suture removal.  If they are unable to remove sutures you may return here for suture removal as well.  Take the cephalexin twice daily for 5 days and monitor for any signs of infection which would include expanding redness around the wound, severe increasing pain, drainage of pus or new fever.  If this occurs, return to the ED or see your doctor for evaluation sooner.

## 2020-01-29 NOTE — ED Provider Notes (Signed)
[facility] EMERGENCY DEPARTMENT Provider Note   CSN: [id number] Arrival date & time: 01/29/20  0749     History Chief Complaint  Patient presents with  . Laceration    Shelby Burke is a 24 y.o. female.  24 year old female with history of asthma and allergic rhinitis, otherwise healthy, presents for evaluation of laceration of the right hand. Patient sustained a laceration around 6:30 PM last night, 14 hours ago, when she reached into a bag and accidentally cut her hand with a craft knife. It bled but bleeding was controlled by pressure. She cleaned it with peroxide as well as wound cleaner last night. This morning noted that it was "deeper" than anticipated so presented for further evaluation and wound care. She has otherwise been well this week. No fever cough vomiting or diarrhea. No known exposures to anyone with COVID-19. She is unsure of her last tetanus booster but believes it may have been 10 years ago.  The history is provided by the patient.  Laceration      Past Medical History:  Diagnosis Date  . Asthma   . Urticaria     Patient Active Problem List   Diagnosis Date Noted  . Other allergic rhinitis 07/31/2019  . Frequent sinus infections 07/31/2019  . Pruritic rash 07/31/2019  . Mild intermittent asthma without complication 07/31/2019    Past Surgical History:  Procedure Laterality Date  . ADENOIDECTOMY    . BREAST REDUCTION SURGERY  2019     OB History   No obstetric history on file.     Family History  Problem Relation Age of Onset  . Asthma Mother   . Allergic rhinitis Mother   . Allergic rhinitis Father     Social History   Tobacco Use  . Smoking status: Never Smoker  . Smokeless tobacco: Never Used  Vaping Use  . Vaping Use: Never used  Substance Use Topics  . Alcohol use: Yes    Comment: once or twice a month  . Drug use: No    Home Medications Prior to Admission medications   Medication Sig Start Date End  Date Taking? Authorizing Provider  albuterol (VENTOLIN HFA) 108 (90 Base) MCG/ACT inhaler SMARTSIG:2 Puff(s) By Mouth Every 4-6 Hours PRN 06/01/19   [provider]  cephALEXin (KEFLEX) 500 MG capsule Take 1 capsule (500 mg total) by mouth 2 (two) times daily for 5 days. 01/29/20 02/03/20  Ree Shay, MD  doxycycline (VIBRAMYCIN) 100 MG capsule Take 1 capsule (100 mg total) by mouth 2 (two) times daily. Patient not taking: Reported on 07/31/2019 06/19/18   Army Melia A, PA-C  fluticasone [facility]) 50 MCG/ACT nasal spray Place 1 spray into both nostrils daily. 06/19/18   Jeannie Fend, PA-C  levocetirizine (XYZAL) 5 MG tablet Take 5 mg by mouth daily. 05/26/19   [provider]  montelukast (SINGULAIR) 10 MG tablet Take 10 mg by mouth daily. 05/26/19   [provider]  predniSONE (DELTASONE) 10 MG tablet TAKE 6 TABLETS DAILY X 3 DAYS THEN DECREASE BY 1 TABLET EVERY 3 DAYS UNTIL FINISHED 07/16/19   [provider]  TRI-LO-MARZIA 0.18/0.215/0.25 MG-25 MCG tab Take 1 tablet by mouth daily. 05/25/19   [provider]    Allergies    Cats claw (uncaria tomentosa), Dog epithelium allergy skin test, Dust mite extract, Grass extracts [gramineae pollens], Molds & smuts, Pollen extract, and Short ragweed pollen ext  Review of Systems   Review of Systems  All systems reviewed  and were reviewed and were negative except as stated in the HPI  Physical Exam Updated Vital Signs BP 121/81 (BP Location: Left Arm)   Pulse 68   Temp 98.5 F (36.9 C) (Oral)   Resp 16   SpO2 99%   Physical Exam Vitals and nursing note reviewed.  Constitutional:      General: She is not in acute distress.    Appearance: Normal appearance. She is well-developed.  HENT:     Head: Normocephalic and atraumatic.     Nose: Nose normal.     Mouth/Throat:     Pharynx: No oropharyngeal exudate.  Eyes:     Conjunctiva/sclera: Conjunctivae normal.  Cardiovascular:     Rate and  Rhythm: Normal rate and regular rhythm.     Heart sounds: Normal heart sounds. No murmur heard.  No friction rub. No gallop.   Pulmonary:     Effort: Pulmonary effort is normal. No respiratory distress.     Breath sounds: No wheezing or rales.  Abdominal:     General: Bowel sounds are normal.     Palpations: Abdomen is soft.     Tenderness: There is no abdominal tenderness. There is no guarding or rebound.  Musculoskeletal:        General: No tenderness. Normal range of motion.     Cervical back: Normal range of motion and neck supple.  Skin:    General: Skin is warm and dry.     Capillary Refill: Capillary refill takes less than 2 seconds.     Findings: No rash.     Comments: 1.5 cm linear laceration to subcutaneous tissue on dorsum of right hand in webspace between base of index and thumb, no tendon exposure. Flexor and extensor tendon function intact  Neurological:     Mental Status: She is alert and oriented to person, place, and time.     Cranial Nerves: No cranial nerve deficit.     Comments: Normal strength 5/5 in upper and lower extremities, normal coordination     ED Results / Procedures / Treatments   Labs (all labs ordered are listed, but only abnormal results are displayed) Labs Reviewed - No data to display  EKG None  Radiology No results found.  Procedures .Marland KitchenLaceration Repair  Date/Time: 01/29/2020 9:02 AM Performed by: Ree Shay, MD Authorized by: Ree Shay, MD   Consent:    Consent obtained:  Verbal   Consent given by:  Patient   Risks discussed:  Infection, pain, poor wound healing and tendon damage   Alternatives discussed:  No treatment Universal protocol:    Procedure explained and questions answered to patient or proxy's satisfaction: yes     Immediately prior to procedure, a time out was called: yes   Anesthesia (see MAR for exact dosages):    Anesthesia method:  Local infiltration   Local anesthetic:  Lidocaine 2% WITH epi Laceration  details:    Location:  Hand   Hand location:  R hand, dorsum   Length (cm):  1.5   Depth (mm):  3 Repair type:    Repair type:  Simple Pre-procedure details:    Preparation:  Patient was prepped and draped in usual sterile fashion Exploration:    Hemostasis achieved with:  Direct pressure   Wound exploration: wound explored through full range of motion and entire depth of wound probed and visualized     Wound extent: no fascia violation noted, no foreign bodies/material noted, no muscle damage noted, no tendon damage noted,  no underlying fracture noted and no vascular damage noted     Contaminated: no   Treatment:    Area cleansed with:  Saline and Betadine   Amount of cleaning:  Standard   Irrigation solution:  Sterile saline   Irrigation volume:  100   Irrigation method:  Syringe Skin repair:    Repair method:  Sutures   Suture size:  5-0   Suture material:  Prolene   Suture technique:  Simple interrupted   Number of sutures:  3 Approximation:    Approximation:  Close Post-procedure details:    Dressing:  Antibiotic ointment and bulky dressing   Patient tolerance of procedure:  Tolerated well, no immediate complications Comments:     After cleaning the wound, it was noted there was a small triangular flap at the superior margin.  This was well aligned and closed.   (including critical care time)  Medications Ordered in ED Medications  Tdap (BOOSTRIX) injection 0.5 mL (has no administration in time range)    ED Course  I have reviewed the triage vital signs and the nursing notes.  Pertinent labs & imaging results that were available during my care of the patient were reviewed by me and considered in my medical decision making (see chart for details).    MDM Rules/Calculators/A&P                          24 year old female with history of asthma, otherwise healthy, here with 1.5 cm laceration to subcutaneous tissue on dorsum of right hand between right index finger  and thumb. No other injuries. Injury occurred approximately 10 hours ago. No signs of infection. No surrounding redness or drainage of pus. No granulation tissue. Given this injury occurred within the past 24 hours with clean wound margins, I feel it is appropriate for closure with sutures. Will provide tetanus booster today. We will also plan to treat with 5-day course of cephalexin.  After cleaning the wound with saline, it was noted there was a small triangular flap at the superior margin.  This was closed and edges well approximated.  Patient tolerated procedure well.  Bacitracin and sterile dressing applied.  Wound care reviewed.  Plan for suture removal in 8 to 10 days by her PCP with return precautions as outlined in the discharge instructions.   Final Clinical Impression(s) / ED Diagnoses Final diagnoses:  Laceration of right hand without foreign body, initial encounter    Rx / DC Orders ED Discharge Orders         Ordered    cephALEXin (KEFLEX) 500 MG capsule  2 times daily     Discontinue  Reprint     01/29/20 0900           Ree Shay, MD 01/29/20 [phone]

## 2020-01-29 NOTE — ED Triage Notes (Addendum)
Pt report cutting her hand with a craft knife yesterday. Laceration near knuckle of R index finger. Last tetanus unknown. Able to move all fingers.

## 2020-02-04 ENCOUNTER — Ambulatory Visit (INDEPENDENT_AMBULATORY_CARE_PROVIDER_SITE_OTHER): Payer: No Typology Code available for payment source

## 2020-02-04 DIAGNOSIS — J309 Allergic rhinitis, unspecified: Secondary | ICD-10-CM | POA: Diagnosis not present

## 2020-02-13 ENCOUNTER — Ambulatory Visit (INDEPENDENT_AMBULATORY_CARE_PROVIDER_SITE_OTHER): Payer: No Typology Code available for payment source

## 2020-02-13 DIAGNOSIS — J309 Allergic rhinitis, unspecified: Secondary | ICD-10-CM | POA: Diagnosis not present

## 2020-02-18 ENCOUNTER — Ambulatory Visit (INDEPENDENT_AMBULATORY_CARE_PROVIDER_SITE_OTHER): Payer: No Typology Code available for payment source

## 2020-02-18 DIAGNOSIS — J309 Allergic rhinitis, unspecified: Secondary | ICD-10-CM

## 2020-02-19 ENCOUNTER — Other Ambulatory Visit: Payer: Self-pay

## 2020-02-19 ENCOUNTER — Ambulatory Visit (HOSPITAL_COMMUNITY)
Admission: EM | Admit: 2020-02-19 | Discharge: 2020-02-19 | Disposition: A | Discharge status: Home / Self Care | Payer: No Typology Code available for payment source | Attending: Family Medicine | Admitting: Family Medicine

## 2020-02-19 ENCOUNTER — Encounter (HOSPITAL_COMMUNITY): Payer: Self-pay

## 2020-02-19 DIAGNOSIS — Z20822 Contact with and (suspected) exposure to covid-19: Secondary | ICD-10-CM | POA: Insufficient documentation

## 2020-02-19 NOTE — Discharge Instructions (Signed)
You have been tested for COVID-19 today. °If your test returns positive, you will receive a phone call from [facility] regarding your results. °Negative test results are not called. °Both positive and negative results area always visible on MyChart. °If you do not have a MyChart account, sign up instructions are provided in your discharge papers. °Please do not hesitate to contact us should you have questions or concerns. ° °

## 2020-02-19 NOTE — ED Triage Notes (Signed)
Pt presents to [facility] for covid test r/t exposure approx. 3 days ago. Pt denies symptoms at this time. Pt has no acute complaints.

## 2020-02-20 LAB — SARS CORONAVIRUS 2 (TAT 6-24 HRS): SARS Coronavirus 2: NEGATIVE

## 2020-02-20 NOTE — ED Provider Notes (Signed)
[facility] CARE CENTER   [id number] 02/19/20 Arrival Time: 1826  ASSESSMENT & PLAN:  1. Exposure to COVID-19 virus      COVID-19 testing sent. See letter/work note on file for self-isolation guidelines. OTC symptom care as needed.   Follow-up Information    [facility], [facility].   Specialty: Family Medicine Why: As needed. Contact information: [address] [place] [address] [phone]               Reviewed expectations re: course of current medical issues. Questions answered. Outlined signs and symptoms indicating need for more acute intervention. Understanding verbalized. After Visit Summary given.   SUBJECTIVE: History from: patient. Shelby Burke is a 24 y.o. female who requests COVID-19 testing. Known COVID-19 contact: reports exposure 3 d ago. Recent travel: none.  Denies: runny nose, congestion, fever, cough, sore throat, difficulty breathing and headache. Normal PO intake without n/v/d.    OBJECTIVE:  Vitals:   02/19/20 1933  BP: 133/88  Pulse: 91  Resp: 16  Temp: 98.2 F (36.8 C)  TempSrc: Oral  SpO2: 100%    General appearance: alert; no distress Eyes: PERRLA; EOMI; conjunctiva normal HENT: [place]; AT; nasal mucosa normal; oral mucosa normal Neck: supple  Lungs: speaks full sentences without difficulty; unlabored Extremities: no edema Skin: warm and dry Neurologic: normal gait Psychological: alert and cooperative; normal mood and affect  Labs:  Labs Reviewed  SARS CORONAVIRUS 2 (TAT 6-24 HRS)      Allergies  Allergen Reactions  . Cats Claw (Uncaria Tomentosa) Other (See Comments)  . Dog Epithelium Allergy Skin Test Other (See Comments)  . Dust Mite Extract Itching and Rash  . Grass Extracts [Gramineae Pollens] Itching and Rash  . Molds & Smuts Itching and Rash  . Pollen Extract Itching and Rash  . Short Ragweed Pollen Ext Itching and Rash    Past Medical History:  Diagnosis Date  . Asthma   . Urticaria     Social History   Socioeconomic History  . Marital status: Single    Spouse name: Not on file  . Number of children: Not on file  . Years of education: Not on file  . Highest education level: Not on file  Occupational History  . Not on file  Tobacco Use  . Smoking status: Never Smoker  . Smokeless tobacco: Never Used  Vaping Use  . Vaping Use: Never used  Substance and Sexual Activity  . Alcohol use: Yes    Comment: once or twice a month  . Drug use: No  . Sexual activity: Not on file  Other Topics Concern  . Not on file  Social History Narrative  . Not on file   Social Determinants of Health   Financial Resource Strain:   . Difficulty of Paying Living Expenses:   Food Insecurity:   . Worried About Programme researcher, broadcasting/film/video in the Last Year:   . Barista in the Last Year:   Transportation Needs:   . Freight forwarder (Medical):   Marland Kitchen Lack of Transportation (Non-Medical):   Physical Activity:   . Days of Exercise per Week:   . Minutes of Exercise per Session:   Stress:   . Feeling of Stress :   Social Connections:   . Frequency of Communication with Friends and Family:   . Frequency of Social Gatherings with Friends and Family:   . Attends Religious Services:   . Active Member of Clubs or Organizations:   . Attends  Club or Organization Meetings:   Marland Kitchen Marital Status:   Intimate Partner Violence:   . Fear of Current or Ex-Partner:   . Emotionally Abused:   Marland Kitchen Physically Abused:   . Sexually Abused:    Family History  Problem Relation Age of Onset  . Asthma Mother   . Allergic rhinitis Mother   . Allergic rhinitis Father    Past Surgical History:  Procedure Laterality Date  . ADENOIDECTOMY    . BREAST REDUCTION SURGERY  2019     Mardella Layman, MD 02/20/20 [phone]

## 2020-02-25 ENCOUNTER — Other Ambulatory Visit: Payer: Self-pay

## 2020-02-25 ENCOUNTER — Other Ambulatory Visit: Payer: No Typology Code available for payment source

## 2020-02-25 DIAGNOSIS — Z20822 Contact with and (suspected) exposure to covid-19: Secondary | ICD-10-CM

## 2020-02-27 ENCOUNTER — Ambulatory Visit (INDEPENDENT_AMBULATORY_CARE_PROVIDER_SITE_OTHER): Payer: No Typology Code available for payment source

## 2020-02-27 DIAGNOSIS — J309 Allergic rhinitis, unspecified: Secondary | ICD-10-CM | POA: Diagnosis not present

## 2020-02-27 LAB — SARS-COV-2, NAA 2 DAY TAT

## 2020-02-27 LAB — NOVEL CORONAVIRUS, NAA: SARS-CoV-2, NAA: NOT DETECTED

## 2020-03-03 ENCOUNTER — Ambulatory Visit (INDEPENDENT_AMBULATORY_CARE_PROVIDER_SITE_OTHER): Payer: No Typology Code available for payment source

## 2020-03-03 DIAGNOSIS — J309 Allergic rhinitis, unspecified: Secondary | ICD-10-CM | POA: Diagnosis not present

## 2020-03-12 ENCOUNTER — Ambulatory Visit (INDEPENDENT_AMBULATORY_CARE_PROVIDER_SITE_OTHER): Payer: No Typology Code available for payment source

## 2020-03-12 DIAGNOSIS — J309 Allergic rhinitis, unspecified: Secondary | ICD-10-CM

## 2020-03-24 ENCOUNTER — Ambulatory Visit (INDEPENDENT_AMBULATORY_CARE_PROVIDER_SITE_OTHER): Payer: No Typology Code available for payment source

## 2020-03-24 DIAGNOSIS — J309 Allergic rhinitis, unspecified: Secondary | ICD-10-CM

## 2020-04-01 ENCOUNTER — Ambulatory Visit (INDEPENDENT_AMBULATORY_CARE_PROVIDER_SITE_OTHER): Payer: No Typology Code available for payment source | Admitting: *Deleted

## 2020-04-01 DIAGNOSIS — J309 Allergic rhinitis, unspecified: Secondary | ICD-10-CM

## 2020-04-07 ENCOUNTER — Ambulatory Visit (INDEPENDENT_AMBULATORY_CARE_PROVIDER_SITE_OTHER): Payer: No Typology Code available for payment source | Admitting: *Deleted

## 2020-04-07 DIAGNOSIS — J309 Allergic rhinitis, unspecified: Secondary | ICD-10-CM | POA: Diagnosis not present

## 2020-04-10 ENCOUNTER — Emergency Department (HOSPITAL_COMMUNITY)
Admission: EM | Admit: 2020-04-10 | Discharge: 2020-04-10 | Disposition: A | Discharge status: Home / Self Care | Payer: No Typology Code available for payment source | Attending: Emergency Medicine | Admitting: Emergency Medicine

## 2020-04-10 ENCOUNTER — Encounter (HOSPITAL_COMMUNITY): Payer: Self-pay | Admitting: Emergency Medicine

## 2020-04-10 ENCOUNTER — Other Ambulatory Visit: Payer: Self-pay

## 2020-04-10 DIAGNOSIS — H66002 Acute suppurative otitis media without spontaneous rupture of ear drum, left ear: Secondary | ICD-10-CM | POA: Diagnosis not present

## 2020-04-10 DIAGNOSIS — Z7951 Long term (current) use of inhaled steroids: Secondary | ICD-10-CM | POA: Insufficient documentation

## 2020-04-10 DIAGNOSIS — H9202 Otalgia, left ear: Secondary | ICD-10-CM | POA: Diagnosis present

## 2020-04-10 DIAGNOSIS — J4520 Mild intermittent asthma, uncomplicated: Secondary | ICD-10-CM | POA: Diagnosis not present

## 2020-04-10 MED ORDER — AMOXICILLIN-POT CLAVULANATE 875-125 MG PO TABS: 1.0000 | ORAL_TABLET | Freq: Two times a day (BID) | ORAL | 0 refills | Status: AC

## 2020-04-10 NOTE — ED Provider Notes (Signed)
[facility] EMERGENCY DEPARTMENT Provider Note   CSN: [id number] Arrival date & time: 04/10/20  1533     History Chief Complaint  Patient presents with  . Otalgia    Shelby Burke is a 24 y.o. female presenting for evaluation of left ear pain.  Patient states her left ear pain began today.  It is severe and constant.  She took 400 mg of ibuprofen without improvement.  It does not radiate.  She denies fevers, chills, nasal congestion, sore throat throat or dental pain.  She denies symptoms on the right side.  She has no other medical problems, takes no medications daily.  HPI     Past Medical History:  Diagnosis Date  . Asthma   . Urticaria     Patient Active Problem List   Diagnosis Date Noted  . Other allergic rhinitis 07/31/2019  . Frequent sinus infections 07/31/2019  . Pruritic rash 07/31/2019  . Mild intermittent asthma without complication 07/31/2019    Past Surgical History:  Procedure Laterality Date  . ADENOIDECTOMY    . BREAST REDUCTION SURGERY  2019     OB History   No obstetric history on file.     Family History  Problem Relation Age of Onset  . Asthma Mother   . Allergic rhinitis Mother   . Allergic rhinitis Father     Social History   Tobacco Use  . Smoking status: Never Smoker  . Smokeless tobacco: Never Used  Vaping Use  . Vaping Use: Never used  Substance Use Topics  . Alcohol use: Yes    Comment: once or twice a month  . Drug use: No    Home Medications Prior to Admission medications   Medication Sig Start Date End Date Taking? Authorizing Provider  albuterol (VENTOLIN HFA) 108 (90 Base) MCG/ACT inhaler SMARTSIG:2 Puff(s) By Mouth Every 4-6 Hours PRN 06/01/19   [provider]  amoxicillin-clavulanate (AUGMENTIN) 875-125 MG tablet Take 1 tablet by mouth every 12 (twelve) hours for 7 days. 04/10/20 04/17/20  Lonnetta Kniskern, PA-C  doxycycline (VIBRAMYCIN) 100 MG capsule Take 1 capsule (100 mg total)  by mouth 2 (two) times daily. Patient not taking: Reported on 07/31/2019 06/19/18   Army Melia A, PA-C  fluticasone [facility]) 50 MCG/ACT nasal spray Place 1 spray into both nostrils daily. 06/19/18   Jeannie Fend, PA-C  levocetirizine (XYZAL) 5 MG tablet Take 5 mg by mouth daily. 05/26/19   [provider]  montelukast (SINGULAIR) 10 MG tablet Take 10 mg by mouth daily. 05/26/19   [provider]  predniSONE (DELTASONE) 10 MG tablet TAKE 6 TABLETS DAILY X 3 DAYS THEN DECREASE BY 1 TABLET EVERY 3 DAYS UNTIL FINISHED 07/16/19   [provider]  TRI-LO-MARZIA 0.18/0.215/0.25 MG-25 MCG tab Take 1 tablet by mouth daily. 05/25/19   [provider]    Allergies    Cats claw (uncaria tomentosa), Dog epithelium allergy skin test, Dust mite extract, Grass extracts [gramineae pollens], Molds & smuts, Pollen extract, and Short ragweed pollen ext  Review of Systems   Review of Systems  Constitutional: Negative for fever.  HENT: Positive for ear pain. Negative for congestion.     Physical Exam Updated Vital Signs BP 130/90 (BP Location: Right Arm)   Pulse 83   Temp 98.6 F (37 C) (Oral)   Resp 16   Ht 5\' 5"  (1.651 m)   Wt 87.8 kg   SpO2 97%   BMI 32.21 kg/m   Physical Exam  Vitals and nursing note reviewed.  Constitutional:      General: She is not in acute distress.    Appearance: She is well-developed.  HENT:     Head: Normocephalic and atraumatic.     Left Ear: Tympanic membrane, ear canal and external ear normal.     Ears:     Comments: Left TM erythematous and bulging.  Right TM without erythema or bulging.  No tenderness palpation of the mastoid on the left side.    Nose:     Comments: No nasal congestion    Mouth/Throat:     Comments: OP clear without tonsillar swelling redness today.  Uvula midline.  No trismus.  No malocclusion. Eyes:     Conjunctiva/sclera: Conjunctivae normal.     Pupils: Pupils are equal, round, and reactive to light.    Cardiovascular:     Rate and Rhythm: Normal rate and regular rhythm.     Pulses: Normal pulses.  Pulmonary:     Effort: Pulmonary effort is normal.     Breath sounds: Normal breath sounds.  Abdominal:     General: There is no distension.  Musculoskeletal:        General: Normal range of motion.     Cervical back: Normal range of motion.  Skin:    General: Skin is warm.     Findings: No rash.  Neurological:     Mental Status: She is alert and oriented to person, place, and time.     ED Results / Procedures / Treatments   Labs (all labs ordered are listed, but only abnormal results are displayed) Labs Reviewed - No data to display  EKG None  Radiology No results found.  Procedures Procedures (including critical care time)  Medications Ordered in ED Medications - No data to display  ED Course  I have reviewed the triage vital signs and the nursing notes.  Pertinent labs & imaging results that were available during my care of the patient were reviewed by me and considered in my medical decision making (see chart for details).    MDM Rules/Calculators/A&P                          Patient presenting for evaluation of left ear pain.  On exam, consistent with otitis media.  No tenderness of the mastoid to indicate mastoiditis. Will tx with abx.  At this time, patient appears safe for discharge.  Return precautions given.  Patient states he understands and agrees to plan.  Final Clinical Impression(s) / ED Diagnoses Final diagnoses:  Acute suppurative otitis media of left ear without spontaneous rupture of tympanic membrane, recurrence not specified    Rx / DC Orders ED Discharge Orders         Ordered    amoxicillin-clavulanate (AUGMENTIN) 875-125 MG tablet  Every 12 hours        04/10/20 1617           Kholton Coate, PA-C 04/10/20 1640    Sabino Donovan, MD 04/10/20 1725

## 2020-04-10 NOTE — ED Triage Notes (Signed)
Pt c/o left side ear pain since this morning taking Ibuprofen with no relief.

## 2020-04-10 NOTE — Discharge Instructions (Signed)
Take antibiotics as prescribed.  Take entire course, even if your symptoms improve. Use Tylenol or ibuprofen as needed for pain.  You may take up to 800 mg, 4 tablets, at a time with meals.  You may also take 1 g of Tylenol at a time 3 times a day. Follow-up with your primary care doctor as needed for further evaluation. Return to the emergency room develop any new, worsening, or concerning symptoms.

## 2020-04-11 ENCOUNTER — Telehealth: Payer: Self-pay

## 2020-04-11 NOTE — Telephone Encounter (Signed)
Called in as she called her pharmacy and the antiviotic had not been called in. In looking at her summary, the script looked like it had been printed. She found it in her paperwork and  Was going to take it to the pharmacy for filling

## 2020-04-17 ENCOUNTER — Ambulatory Visit (INDEPENDENT_AMBULATORY_CARE_PROVIDER_SITE_OTHER): Payer: No Typology Code available for payment source | Admitting: *Deleted

## 2020-04-17 DIAGNOSIS — J309 Allergic rhinitis, unspecified: Secondary | ICD-10-CM

## 2020-04-21 ENCOUNTER — Ambulatory Visit (INDEPENDENT_AMBULATORY_CARE_PROVIDER_SITE_OTHER): Payer: No Typology Code available for payment source | Admitting: *Deleted

## 2020-04-21 DIAGNOSIS — J309 Allergic rhinitis, unspecified: Secondary | ICD-10-CM

## 2020-05-05 ENCOUNTER — Ambulatory Visit (INDEPENDENT_AMBULATORY_CARE_PROVIDER_SITE_OTHER): Payer: No Typology Code available for payment source

## 2020-05-05 DIAGNOSIS — J309 Allergic rhinitis, unspecified: Secondary | ICD-10-CM

## 2020-05-12 ENCOUNTER — Ambulatory Visit (INDEPENDENT_AMBULATORY_CARE_PROVIDER_SITE_OTHER): Payer: No Typology Code available for payment source

## 2020-05-12 DIAGNOSIS — J309 Allergic rhinitis, unspecified: Secondary | ICD-10-CM | POA: Diagnosis not present

## 2020-05-18 ENCOUNTER — Ambulatory Visit: Payer: No Typology Code available for payment source | Attending: Family

## 2020-05-18 DIAGNOSIS — Z23 Encounter for immunization: Secondary | ICD-10-CM

## 2020-05-29 ENCOUNTER — Ambulatory Visit (INDEPENDENT_AMBULATORY_CARE_PROVIDER_SITE_OTHER): Payer: No Typology Code available for payment source

## 2020-05-29 DIAGNOSIS — J309 Allergic rhinitis, unspecified: Secondary | ICD-10-CM

## 2020-06-05 ENCOUNTER — Ambulatory Visit (INDEPENDENT_AMBULATORY_CARE_PROVIDER_SITE_OTHER): Payer: No Typology Code available for payment source

## 2020-06-05 DIAGNOSIS — J309 Allergic rhinitis, unspecified: Secondary | ICD-10-CM | POA: Diagnosis not present

## 2020-06-22 ENCOUNTER — Ambulatory Visit (INDEPENDENT_AMBULATORY_CARE_PROVIDER_SITE_OTHER): Payer: No Typology Code available for payment source

## 2020-06-22 DIAGNOSIS — J309 Allergic rhinitis, unspecified: Secondary | ICD-10-CM | POA: Diagnosis not present

## 2020-06-29 ENCOUNTER — Ambulatory Visit (INDEPENDENT_AMBULATORY_CARE_PROVIDER_SITE_OTHER): Payer: No Typology Code available for payment source | Admitting: *Deleted

## 2020-06-29 DIAGNOSIS — J309 Allergic rhinitis, unspecified: Secondary | ICD-10-CM | POA: Diagnosis not present

## 2020-07-09 ENCOUNTER — Ambulatory Visit (INDEPENDENT_AMBULATORY_CARE_PROVIDER_SITE_OTHER): Payer: No Typology Code available for payment source

## 2020-07-09 DIAGNOSIS — J309 Allergic rhinitis, unspecified: Secondary | ICD-10-CM | POA: Diagnosis not present

## 2020-07-14 ENCOUNTER — Ambulatory Visit (INDEPENDENT_AMBULATORY_CARE_PROVIDER_SITE_OTHER): Payer: No Typology Code available for payment source | Admitting: *Deleted

## 2020-07-14 DIAGNOSIS — J309 Allergic rhinitis, unspecified: Secondary | ICD-10-CM | POA: Diagnosis not present

## 2020-07-15 NOTE — Progress Notes (Signed)
   Covid-19 Vaccination Clinic  Name:  Shelby Burke    MRN: [id number] DOB: 04-15-1996  07/15/2020  [name] was observed post Covid-19 immunization for 15 minutes without incident. She was provided with Vaccine Information Sheet and instruction to access the V-Safe system.   [name] was instructed to call 911 with any severe reactions post vaccine: Marland Kitchen Difficulty breathing  . Swelling of face and throat  . A fast heartbeat  . A bad rash all over body  . Dizziness and weakness   Immunizations Administered    Name Date Dose VIS Date Route   Pfizer COVID-19 Vaccine 05/18/2020  9:30 AM 0.3 mL 05/06/2020 Intramuscular   Manufacturer: [employer], [employer]   Lot: [id number]   NDC: [address]-4

## 2020-07-20 ENCOUNTER — Telehealth: Payer: Self-pay | Admitting: *Deleted

## 2020-07-20 NOTE — Telephone Encounter (Signed)
Patient's maintenance vials expire 07/29/2020. A vial re-order form has been faxed to [facility] at [phone] requesting new vials.

## 2020-07-21 ENCOUNTER — Ambulatory Visit (INDEPENDENT_AMBULATORY_CARE_PROVIDER_SITE_OTHER): Payer: No Typology Code available for payment source

## 2020-07-21 DIAGNOSIS — J309 Allergic rhinitis, unspecified: Secondary | ICD-10-CM | POA: Diagnosis not present

## 2020-07-27 ENCOUNTER — Other Ambulatory Visit: Payer: No Typology Code available for payment source

## 2020-07-27 ENCOUNTER — Ambulatory Visit (INDEPENDENT_AMBULATORY_CARE_PROVIDER_SITE_OTHER): Payer: No Typology Code available for payment source

## 2020-07-27 DIAGNOSIS — J309 Allergic rhinitis, unspecified: Secondary | ICD-10-CM

## 2020-07-27 NOTE — Telephone Encounter (Signed)
Called and spoke with the nurse at [facility] with [facility] to follow up on re-ordering her allergy vials. A message has been sent to the nurse for Dr. Melynda Ripple which is Keamber's MD at that office. She stated that the nurse will call me back with an update on re-ordering her vials.

## 2020-07-29 ENCOUNTER — Other Ambulatory Visit: Payer: No Typology Code available for payment source

## 2020-08-05 NOTE — Telephone Encounter (Signed)
Patient's allergy vials and new paperwork has come in today. The new vials have been placed in the refridgerator. Called patient and left a detailed voicemail advising per DPR permission of the new vials being in office. Her old injection records have been labeled and placed in bulk scanning.

## 2020-08-10 ENCOUNTER — Ambulatory Visit (INDEPENDENT_AMBULATORY_CARE_PROVIDER_SITE_OTHER): Payer: No Typology Code available for payment source

## 2020-08-10 DIAGNOSIS — J309 Allergic rhinitis, unspecified: Secondary | ICD-10-CM

## 2020-08-10 NOTE — Progress Notes (Addendum)
After I gave an injection from Vial B patient stated "and I am prepared to stay since it is a new vial". Vials had no stickers on them and I was unaware it was a new vial. Patient took her antihistamines however another dose was given in office. Patient waited 30 minutes with no problems. Will continue with 0.37ml at next injection.

## 2020-08-19 ENCOUNTER — Ambulatory Visit (INDEPENDENT_AMBULATORY_CARE_PROVIDER_SITE_OTHER): Payer: No Typology Code available for payment source

## 2020-08-19 DIAGNOSIS — J309 Allergic rhinitis, unspecified: Secondary | ICD-10-CM

## 2020-08-26 ENCOUNTER — Ambulatory Visit (INDEPENDENT_AMBULATORY_CARE_PROVIDER_SITE_OTHER): Payer: No Typology Code available for payment source

## 2020-08-26 DIAGNOSIS — J309 Allergic rhinitis, unspecified: Secondary | ICD-10-CM | POA: Diagnosis not present

## 2020-09-02 ENCOUNTER — Ambulatory Visit (INDEPENDENT_AMBULATORY_CARE_PROVIDER_SITE_OTHER): Payer: No Typology Code available for payment source | Admitting: *Deleted

## 2020-09-02 DIAGNOSIS — J309 Allergic rhinitis, unspecified: Secondary | ICD-10-CM

## 2020-09-16 ENCOUNTER — Ambulatory Visit (INDEPENDENT_AMBULATORY_CARE_PROVIDER_SITE_OTHER): Payer: No Typology Code available for payment source | Admitting: *Deleted

## 2020-09-16 DIAGNOSIS — J309 Allergic rhinitis, unspecified: Secondary | ICD-10-CM

## 2020-10-07 ENCOUNTER — Ambulatory Visit (INDEPENDENT_AMBULATORY_CARE_PROVIDER_SITE_OTHER): Payer: No Typology Code available for payment source

## 2020-10-07 DIAGNOSIS — J309 Allergic rhinitis, unspecified: Secondary | ICD-10-CM | POA: Diagnosis not present

## 2020-10-15 ENCOUNTER — Ambulatory Visit (INDEPENDENT_AMBULATORY_CARE_PROVIDER_SITE_OTHER): Payer: No Typology Code available for payment source | Admitting: *Deleted

## 2020-10-15 DIAGNOSIS — J309 Allergic rhinitis, unspecified: Secondary | ICD-10-CM

## 2020-10-17 ENCOUNTER — Emergency Department (HOSPITAL_COMMUNITY)
Admission: EM | Admit: 2020-10-17 | Discharge: 2020-10-17 | Disposition: A | Discharge status: Home / Self Care | Payer: No Typology Code available for payment source | Attending: Emergency Medicine | Admitting: Emergency Medicine

## 2020-10-17 ENCOUNTER — Other Ambulatory Visit: Payer: Self-pay

## 2020-10-17 ENCOUNTER — Emergency Department (HOSPITAL_COMMUNITY): Payer: No Typology Code available for payment source

## 2020-10-17 ENCOUNTER — Encounter (HOSPITAL_COMMUNITY): Payer: Self-pay | Admitting: Emergency Medicine

## 2020-10-17 DIAGNOSIS — R079 Chest pain, unspecified: Secondary | ICD-10-CM

## 2020-10-17 DIAGNOSIS — U071 COVID-19: Secondary | ICD-10-CM | POA: Diagnosis not present

## 2020-10-17 DIAGNOSIS — F17200 Nicotine dependence, unspecified, uncomplicated: Secondary | ICD-10-CM | POA: Diagnosis not present

## 2020-10-17 DIAGNOSIS — J45909 Unspecified asthma, uncomplicated: Secondary | ICD-10-CM | POA: Diagnosis not present

## 2020-10-17 LAB — CBC WITH DIFFERENTIAL/PLATELET
Abs Immature Granulocytes: 0.02 10*3/uL (ref 0.00–0.07)
Basophils Absolute: 0 10*3/uL (ref 0.0–0.1)
Basophils Relative: 0 %
Eosinophils Absolute: 0.2 10*3/uL (ref 0.0–0.5)
Eosinophils Relative: 3 %
HCT: 42.5 % (ref 36.0–46.0)
Hemoglobin: 13.9 g/dL (ref 12.0–15.0)
Immature Granulocytes: 0 %
Lymphocytes Relative: 44 %
Lymphs Abs: 3.1 10*3/uL (ref 0.7–4.0)
MCH: 29.4 pg (ref 26.0–34.0)
MCHC: 32.7 g/dL (ref 30.0–36.0)
MCV: 89.9 fL (ref 80.0–100.0)
Monocytes Absolute: 0.5 10*3/uL (ref 0.1–1.0)
Monocytes Relative: 8 %
Neutro Abs: 3.1 10*3/uL (ref 1.7–7.7)
Neutrophils Relative %: 45 %
Platelets: 241 10*3/uL (ref 150–400)
RBC: 4.73 MIL/uL (ref 3.87–5.11)
RDW: 12.9 % (ref 11.5–15.5)
WBC: 7 10*3/uL (ref 4.0–10.5)
nRBC: 0 % (ref 0.0–0.2)

## 2020-10-17 LAB — COMPREHENSIVE METABOLIC PANEL
ALT: 20 U/L (ref 0–44)
AST: 34 U/L (ref 15–41)
Albumin: 3.8 g/dL (ref 3.5–5.0)
Alkaline Phosphatase: 50 U/L (ref 38–126)
Anion gap: 5 (ref 5–15)
BUN: 6 mg/dL (ref 6–20)
CO2: 26 mmol/L (ref 22–32)
Calcium: 9.3 mg/dL (ref 8.9–10.3)
Chloride: 105 mmol/L (ref 98–111)
Creatinine, Ser: 0.77 mg/dL (ref 0.44–1.00)
GFR, Estimated: >60 mL/min (ref >60)
Glucose, Bld: 100 mg/dL — ABNORMAL HIGH (ref 70–99)
Potassium: 4.3 mmol/L (ref 3.5–5.1)
Sodium: 136 mmol/L (ref 135–145)
Total Bilirubin: 1.3 mg/dL — ABNORMAL HIGH (ref 0.3–1.2)
Total Protein: 7.7 g/dL (ref 6.5–8.1)

## 2020-10-17 LAB — POC SARS CORONAVIRUS 2 AG -  ED: SARS Coronavirus 2 Ag: POSITIVE — AB

## 2020-10-17 LAB — D-DIMER, QUANTITATIVE: D-Dimer, Quant: 0.35 ug/mL-FEU (ref 0.00–0.50)

## 2020-10-17 LAB — LIPASE, BLOOD: Lipase: 51 U/L (ref 11–51)

## 2020-10-17 LAB — TROPONIN I (HIGH SENSITIVITY)
Troponin I (High Sensitivity): <2 ng/L (ref <18)
Troponin I (High Sensitivity): <2 ng/L (ref <18)

## 2020-10-17 LAB — I-STAT BETA HCG BLOOD, ED (MC, WL, AP ONLY): I-stat hCG, quantitative: <5 m[IU]/mL (ref <5)

## 2020-10-17 MED ORDER — KETOROLAC TROMETHAMINE 30 MG/ML IJ SOLN
30.0000 mg | Freq: Once | INTRAMUSCULAR | Status: AC
  Administered 2020-10-17: 30 mg via INTRAVENOUS
  Filled 2020-10-17: qty 1

## 2020-10-17 NOTE — ED Provider Notes (Signed)
Patient placed in Quick Look pathway, seen and evaluated   Chief Complaint: chest pain  HPI:   25 y/o F presenting for eval of chest pain that started this morning. Reports sharp pain to the mid/left chest. Also reports a squeezing pain to the LUE. Reports associated sob, nausea but denies vomiting. Denies cough. Also has some abd pain as well.   Denies leg pain/swelling, hemoptysis, recent surgery/trauma, recent long travel, personal hx of cancer, or hx of DVT/PE. Is on birth control.    ROS: chest pain, sob, nausea (one)  Physical Exam:   Gen: No distress  Neuro: Awake and Alert  Skin: Warm    Focused Exam: RRR, lungs CTAB. Mild epigastric and lower abd discomfort   Initiation of care has begun. The patient has been counseled on the process, plan, and necessity for staying for the completion/evaluation, and the remainder of the medical screening examination  MSE was initiated and I personally evaluated the patient and placed orders (if any) at  12:32 PM on October 17, 2020.  The patient appears stable so that the remainder of the MSE may be completed by another provider.    Shelby Burke 10/17/20 1232    Gwyneth Sprout, MD 10/17/20 2109

## 2020-10-17 NOTE — ED Triage Notes (Signed)
C/o sharp pain to center and L chest since 8am with L arm tingling.  Also reports R jaw pain and frontal headache.  C/o sob and nausea.

## 2020-10-17 NOTE — ED Provider Notes (Signed)
[facility] EMERGENCY DEPARTMENT Provider Note   CSN: [id number] Arrival date & time: 10/17/20  1219     History Chief Complaint  Patient presents with  . Chest Pain    Shelby Burke is a 25 y.o. female.  The history is provided by the patient.  Chest Pain Pain location:  L chest Pain quality comment:  Like someone is punching me Pain radiates to:  L jaw and L arm (left arm feels like "a million blood pressure cuffs") Pain severity:  Severe Onset quality:  Sudden Duration: started at 8 am today. Timing:  Constant Progression:  Unchanged Chronicity:  New Context: at rest   Relieved by:  Nothing Worsened by:  Nothing Associated symptoms: nausea and shortness of breath   Associated symptoms: no abdominal pain, no back pain, no cough, no diaphoresis, no fatigue, no fever, no lower extremity edema, no palpitations and no vomiting        Past Medical History:  Diagnosis Date  . Asthma   . Urticaria     Patient Active Problem List   Diagnosis Date Noted  . Other allergic rhinitis 07/31/2019  . Frequent sinus infections 07/31/2019  . Pruritic rash 07/31/2019  . Mild intermittent asthma without complication 07/31/2019    Past Surgical History:  Procedure Laterality Date  . ADENOIDECTOMY    . BREAST REDUCTION SURGERY  2019     OB History   No obstetric history on file.     Family History  Problem Relation Age of Onset  . Asthma Mother   . Allergic rhinitis Mother   . Allergic rhinitis Father     Social History   Tobacco Use  . Smoking status: Current Some Day Smoker  . Smokeless tobacco: Never Used  Vaping Use  . Vaping Use: Never used  Substance Use Topics  . Alcohol use: Yes    Comment: once or twice a month  . Drug use: No    Home Medications Prior to Admission medications   Medication Sig Start Date End Date Taking? Authorizing Provider  albuterol (VENTOLIN HFA) 108 (90 Base) MCG/ACT inhaler Inhale 2 puffs into the lungs  every 6 (six) hours as needed for wheezing or shortness of breath. 06/01/19  Yes [provider]  aspirin EC 325 MG tablet Take 325 mg by mouth every 6 (six) hours as needed for moderate pain.   Yes [provider]  escitalopram (LEXAPRO) 10 MG tablet Take 1 tablet by mouth daily. 08/31/20  Yes [provider]  fluticasone (FLONASE) 50 MCG/ACT nasal spray Place 1 spray into both nostrils daily. 06/19/18  Yes Jeannie Fend, PA-C  levocetirizine (XYZAL) 5 MG tablet Take 5 mg by mouth daily. 05/26/19  Yes [provider]  Multiple Vitamin (MULTIVITAMIN ADULT) TABS Take 1 tablet by mouth daily.   Yes [provider]  Probiotic Product (PROBIOTIC PO) Take 1 capsule by mouth daily.   Yes [provider]  TRI-LO-MARZIA 0.18/0.215/0.25 MG-25 MCG tab Take 1 tablet by mouth daily. 05/25/19  Yes [provider]  doxycycline (VIBRAMYCIN) 100 MG capsule Take 1 capsule (100 mg total) by mouth 2 (two) times daily. Patient not taking: No sig reported 06/19/18   Army Melia A, PA-C  predniSONE (DELTASONE) 10 MG tablet TAKE 6 TABLETS DAILY X 3 DAYS THEN DECREASE BY 1 TABLET EVERY 3 DAYS UNTIL FINISHED 07/16/19   [provider]    Allergies    Cats claw (uncaria tomentosa), Dog epithelium allergy skin test,  Dust mite extract, Grass extracts [gramineae pollens], Molds & smuts, Pollen extract, and Short ragweed pollen ext  Review of Systems   Review of Systems  Constitutional: Negative for chills, diaphoresis, fatigue and fever.  HENT: Negative for ear pain and sore throat.   Eyes: Negative for pain and visual disturbance.  Respiratory: Positive for shortness of breath. Negative for cough.   Cardiovascular: Positive for chest pain. Negative for palpitations.  Gastrointestinal: Positive for nausea. Negative for abdominal pain and vomiting.  Genitourinary: Negative for dysuria and hematuria.  Musculoskeletal: Negative for arthralgias and back  pain.  Skin: Negative for color change and rash.  Neurological: Negative for seizures and syncope.  All other systems reviewed and are negative.   Physical Exam Updated Vital Signs BP 136/85 (BP Location: Right Arm)   Pulse 68   Temp 100.1 F (37.8 C) (Oral)   Resp 13   LMP 10/14/2020   SpO2 100%   Physical Exam Vitals and nursing note reviewed.  Constitutional:      General: She is not in acute distress.    Appearance: She is well-developed.  HENT:     Head: Normocephalic and atraumatic.  Eyes:     Conjunctiva/sclera: Conjunctivae normal.  Cardiovascular:     Rate and Rhythm: Normal rate and regular rhythm.     Heart sounds: No murmur heard.   Pulmonary:     Effort: Pulmonary effort is normal. No respiratory distress.     Breath sounds: Normal breath sounds.  Abdominal:     Palpations: Abdomen is soft.     Tenderness: There is no abdominal tenderness.  Musculoskeletal:     Cervical back: Neck supple.  Skin:    General: Skin is warm and dry.  Neurological:     Mental Status: She is alert.     ED Results / Procedures / Treatments   Labs (all labs ordered are listed, but only abnormal results are displayed) Labs Reviewed  COMPREHENSIVE METABOLIC PANEL - Abnormal; Notable for the following components:      Result Value   Glucose, Bld 100 (*)    Total Bilirubin 1.3 (*)    All other components within normal limits  POC SARS CORONAVIRUS 2 AG -  ED - Abnormal; Notable for the following components:   SARS Coronavirus 2 Ag POSITIVE (*)    All other components within normal limits  CBC WITH DIFFERENTIAL/PLATELET  LIPASE, BLOOD  D-DIMER, QUANTITATIVE  I-STAT BETA HCG BLOOD, ED ([facility], [facility], [facility] ONLY)  TROPONIN I (HIGH SENSITIVITY)  TROPONIN I (HIGH SENSITIVITY)    EKG EKG Interpretation  Date/Time:  Saturday October 17 2020 12:28:05 EDT Ventricular Rate:  73 PR Interval:  112 QRS Duration: 82 QT Interval:  390 QTC Calculation: 429 R Axis:   75 Text  Interpretation: Normal sinus rhythm with sinus arrhythmia Normal ECG normalaxis no ischemia Confirmed by Pieter Partridge ([id number]) on 10/17/2020 5:25:46 PM   Radiology DG Chest 2 View  Result Date: 10/17/2020 CLINICAL DATA:  Chest pain, dyspnea, nausea EXAM: CHEST - 2 VIEW COMPARISON:  None. FINDINGS: Normal heart size. Normal mediastinal contour. No pneumothorax. No pleural effusion. Lungs appear clear, with no acute consolidative airspace disease and no pulmonary edema. IMPRESSION: No active cardiopulmonary disease. Electronically Signed   By: Delbert Phenix M.D.   On: 10/17/2020 12:56    Procedures Procedures   Medications Ordered in ED Medications - No data to display  ED Course  I have reviewed the triage vital signs and the nursing notes.  Pertinent labs & imaging results that were available during my care of the patient were reviewed by me and considered in my medical decision making (see chart for details).    MDM Rules/Calculators/A&P                          Lisbeth Renshaw presented with chest pain.  She was noted to be slightly febrile at 1600.  Therefore, a Covid test was ordered.  I was actually leaning against COVID-19 as a diagnosis because the patient has been immunized and boosted.  However, when I talk it over with the patient and her mother who was accompanying the patient, they wanted to go ahead and proceed with testing.  She ended up being positive.  She was given instructions on quarantining away from others.  We talked about symptomatic management.  She was given return precautions.  In the course of her ED work-up, she was evaluated for ACS, PE, and pneumonia.  She was not PERC negative and required a D-dimer.  Fortunately this was negative, and work-up for PE was discontinued.  I do think COVID-19 is at the root of her symptoms, and she could have some pleuritic or pulmonary inflammation due to the viral etiology of her symptoms. Final Clinical Impression(s) / ED  Diagnoses Final diagnoses:  COVID-19  Chest pain, unspecified type    Rx / DC Orders ED Discharge Orders    None       Koleen Distance, MD 10/17/20 [phone]

## 2020-10-17 NOTE — ED Notes (Signed)
Attempted IV access x 2 with no success.  Will have another RN attempt 

## 2020-10-18 ENCOUNTER — Encounter: Payer: Self-pay | Admitting: Physician Assistant

## 2020-10-18 ENCOUNTER — Other Ambulatory Visit: Payer: Self-pay | Admitting: Physician Assistant

## 2020-10-18 DIAGNOSIS — Z7189 Other specified counseling: Secondary | ICD-10-CM

## 2020-10-18 DIAGNOSIS — E663 Overweight: Secondary | ICD-10-CM | POA: Insufficient documentation

## 2020-10-18 MED ORDER — NIRMATRELVIR/RITONAVIR (PAXLOVID)TABLET: 3.0000 | ORAL_TABLET | Freq: Two times a day (BID) | ORAL | 0 refills | Status: AC

## 2020-10-18 NOTE — Progress Notes (Signed)
Outpatient Oral COVID Treatment Note  I connected with Shelby Burke on 10/18/2020/12:55 PM by telephone and verified that I am speaking with the correct person using two identifiers.  I discussed the limitations, risks, security, and privacy concerns of performing an evaluation and management service by telephone and the availability of in person appointments. I also discussed with the patient that there may be a patient responsible charge related to this service. The patient expressed understanding and agreed to proceed.  Patient location: home  Provider location: office at home  Diagnosis: COVID-19 infection  Purpose of visit: Discussion of potential use of Molnupiravir or Paxlovid, a new treatment for mild to moderate COVID-19 viral infection in non-hospitalized patients.   Subjective: Patient is a 25 y.o. female who has been diagnosed with COVID 19 viral infection.  Their symptoms began on 4/2 with chest pain    Past Medical History:  Diagnosis Date  . Asthma   . Overweight (BMI 25.0-29.9)   . Urticaria     Allergies  Allergen Reactions  . Cats Claw (Uncaria Tomentosa) Other (See Comments)  . Dog Epithelium Allergy Skin Test Other (See Comments)  . Dust Mite Extract Itching and Rash  . Grass Extracts [Gramineae Pollens] Itching and Rash  . Molds & Smuts Itching and Rash  . Pollen Extract Itching and Rash  . Short Ragweed Pollen Ext Itching and Rash     Current Outpatient Medications:  .  nirmatrelvir/ritonavir EUA (PAXLOVID) TABS, Take 3 tablets by mouth 2 (two) times daily for 5 days. Take nirmatrelvir (150 mg) two tablet(s) twice daily for 5 days and ritonavir (100 mg) one tablet twice daily for 5 days., Disp: 30 tablet, Rfl: 0 .  albuterol (VENTOLIN HFA) 108 (90 Base) MCG/ACT inhaler, Inhale 2 puffs into the lungs every 6 (six) hours as needed for wheezing or shortness of breath., Disp: , Rfl:  .  aspirin EC 325 MG tablet, Take 325 mg by mouth every 6 (six) hours as needed  for moderate pain., Disp: , Rfl:  .  doxycycline (VIBRAMYCIN) 100 MG capsule, Take 1 capsule (100 mg total) by mouth 2 (two) times daily. (Patient not taking: No sig reported), Disp: 20 capsule, Rfl: 0 .  escitalopram (LEXAPRO) 10 MG tablet, Take 1 tablet by mouth daily., Disp: , Rfl:  .  fluticasone (FLONASE) 50 MCG/ACT nasal spray, Place 1 spray into both nostrils daily., Disp: 16 g, Rfl: 2 .  levocetirizine (XYZAL) 5 MG tablet, Take 5 mg by mouth daily., Disp: , Rfl:  .  Multiple Vitamin (MULTIVITAMIN ADULT) TABS, Take 1 tablet by mouth daily., Disp: , Rfl:  .  predniSONE (DELTASONE) 10 MG tablet, TAKE 6 TABLETS DAILY X 3 DAYS THEN DECREASE BY 1 TABLET EVERY 3 DAYS UNTIL FINISHED, Disp: , Rfl:  .  Probiotic Product (PROBIOTIC PO), Take 1 capsule by mouth daily., Disp: , Rfl:  .  TRI-LO-MARZIA 0.18/0.215/0.25 MG-25 MCG tab, Take 1 tablet by mouth daily., Disp: , Rfl:   Objective: Patient sounds stable on phone.  They are in no apparent distress.  Breathing is non labored.  Mood and behavior are normal.  Laboratory Data:  Recent Results (from the past 2160 hour(s))  CBC with Differential     Status: None   Collection Time: 10/17/20 12:36 PM  Result Value Ref Range   WBC 7.0 4.0 - 10.5 K/uL   RBC 4.73 3.87 - 5.11 MIL/uL   Hemoglobin 13.9 12.0 - 15.0 g/dL   HCT 42.5 36.0 - 46.0 %  MCV 89.9 80.0 - 100.0 fL   MCH 29.4 26.0 - 34.0 pg   MCHC 32.7 30.0 - 36.0 g/dL   RDW 12.9 11.5 - 15.5 %   Platelets 241 150 - 400 K/uL   nRBC 0.0 0.0 - 0.2 %   Neutrophils Relative % 45 %   Neutro Abs 3.1 1.7 - 7.7 K/uL   Lymphocytes Relative 44 %   Lymphs Abs 3.1 0.7 - 4.0 K/uL   Monocytes Relative 8 %   Monocytes Absolute 0.5 0.1 - 1.0 K/uL   Eosinophils Relative 3 %   Eosinophils Absolute 0.2 0.0 - 0.5 K/uL   Basophils Relative 0 %   Basophils Absolute 0.0 0.0 - 0.1 K/uL   Immature Granulocytes 0 %   Abs Immature Granulocytes 0.02 0.00 - 0.07 K/uL    Comment: Performed at [facility] [address], [place], [place] [address]  Comprehensive metabolic panel     Status: Abnormal   Collection Time: 10/17/20 12:36 PM  Result Value Ref Range   Sodium 136 135 - 145 mmol/L   Potassium 4.3 3.5 - 5.1 mmol/L   Chloride 105 98 - 111 mmol/L   CO2 26 22 - 32 mmol/L   Glucose, Bld 100 (H) 70 - 99 mg/dL    Comment: Glucose reference range applies only to samples taken after fasting for at least 8 hours.   BUN 6 6 - 20 mg/dL   Creatinine, Ser 0.77 0.44 - 1.00 mg/dL   Calcium 9.3 8.9 - 10.3 mg/dL   Total Protein 7.7 6.5 - 8.1 g/dL   Albumin 3.8 3.5 - 5.0 g/dL   AST 34 15 - 41 U/L   ALT 20 0 - 44 U/L   Alkaline Phosphatase 50 38 - 126 U/L   Total Bilirubin 1.3 (H) 0.3 - 1.2 mg/dL   GFR, Estimated >60 >60 mL/min    Comment: (NOTE) Calculated using the CKD-EPI Creatinine Equation (2021)    Anion gap 5 5 - 15    Comment: Performed at [facility] [address] [place], [place] [address]  Lipase, blood     Status: None   Collection Time: 10/17/20 12:36 PM  Result Value Ref Range   Lipase 51 11 - 51 U/L    Comment: Performed at [facility] [address] [place], [place] [address]  Troponin I (High Sensitivity)     Status: None   Collection Time: 10/17/20 12:36 PM  Result Value Ref Range   Troponin I (High Sensitivity) <2 <18 ng/L    Comment: (NOTE) Elevated high sensitivity troponin I (hsTnI) values and significant  changes across serial measurements may suggest ACS but many other  chronic and acute conditions are known to elevate hsTnI results.  Refer to the "Links" section for chest pain algorithms and additional  guidance. Performed at [facility], [place] [address] [place], [place] [address]   I-Stat Beta hCG blood, ED ([facility], [facility], [facility] only)     Status: None   Collection Time: 10/17/20 12:45 PM  Result Value Ref Range   I-stat hCG, quantitative <5.0 <5 mIU/mL   Comment 3            Comment:   GEST. AGE      CONC.  (mIU/mL)   <=1 WEEK        5 - 50      2 WEEKS       50 - 500  3 WEEKS       100 - 10,000     4 WEEKS     1,000 - 30,000        FEMALE AND NON-PREGNANT FEMALE:     LESS THAN 5 mIU/mL   Troponin I (High Sensitivity)     Status: None   Collection Time: 10/17/20  4:30 PM  Result Value Ref Range   Troponin I (High Sensitivity) <2 <18 ng/L    Comment: (NOTE) Elevated high sensitivity troponin I (hsTnI) values and significant  changes across serial measurements may suggest ACS but many other  chronic and acute conditions are known to elevate hsTnI results.  Refer to the "Links" section for chest pain algorithms and additional  guidance. Performed at [facility], [place] [address] [place], [place] [address]   D-dimer, quantitative     Status: None   Collection Time: 10/17/20  6:39 PM  Result Value Ref Range   D-Dimer, Quant 0.35 0.00 - 0.50 ug/mL-FEU    Comment: (NOTE) At the manufacturer cut-off value of 0.5 g/mL FEU, this assay has a negative predictive value of 95-100%.This assay is intended for use in conjunction with a clinical pretest probability (PTP) assessment model to exclude pulmonary embolism (PE) and deep venous thrombosis (DVT) in outpatients suspected of PE or DVT. Results should be correlated with clinical presentation. Performed at [facility], [place] [address] [place], [place] [address]   POC SARS Coronavirus 2 Ag-ED - Nasal Swab (BD Veritor Kit)     Status: Abnormal   Collection Time: 10/17/20  7:27 PM  Result Value Ref Range   SARS Coronavirus 2 Ag POSITIVE (A) NEGATIVE    Comment: (NOTE) SARS-CoV-2 antigen PRESENT.  Positive results indicate the presence of viral antigens, but clinical correlation with patient history and other diagnostic information is necessary to determine patient infection status.  Positive results do not rule out bacterial infection or co-infection  with other viruses. False positive results are rare but can occur, and confirmatory RT-PCR testing may be  appropriate in some circumstances. The expected result is Negative.  Fact Sheet for Patients: HandmadeRecipes.com.cy  Fact Sheet for Healthcare Providers: FuneralLife.at  This test is not yet approved or cleared by the [place] FDA and  has been authorized for detection and/or diagnosis of SARS-CoV-2 by FDA under an Emergency Use Authorization (EUA).  This EUA will remain in effect (meaning this test can be used) for the duration of  the COVID-19 declaration under Section 564(b)(1) of the Act, 21 U.S.C. section [phone]( b)(1), unless the authorization is terminated or revoked sooner.       Assessment: 25 y.o. female with mild/moderate COVID 19 viral infection diagnosed on 4/2 at high risk for progression to severe COVID 19.  Plan:  This patient is a 25 y.o. female that meets the following criteria for Emergency Use Authorization of: Paxlovid 1. Age >12 yr AND > 40 kg 2. SARS-COV-2 positive test 3. Symptom onset < 5 days 4. Mild-to-moderate COVID disease with high risk for severe progression to hospitalization or death  I have spoken and communicated the following to the patient or parent/caregiver regarding: 1. Paxlovid is an unapproved drug that is authorized for use under an Emergency Use Authorization.  2. There are no adequate, approved, available products for the treatment of COVID-19 in adults who have mild-to-moderate COVID-19 and are at high risk for progressing to severe COVID-19, including hospitalization or death. 3. Other therapeutics are currently authorized. For additional  information on all products authorized for treatment or prevention of COVID-19, please see TanEmporium.pl.  4. There are benefits and risks of taking this treatment as outlined in the "Fact Sheet for Patients and Caregivers."  5. "Fact Sheet  for Patients and Caregivers" was reviewed with patient. A hard copy will be provided to patient from pharmacy prior to the patient receiving treatment. 6. Patients should continue to self-isolate and use infection control measures (e.g., wear mask, isolate, social distance, avoid sharing personal items, clean and disinfect "high touch" surfaces, and frequent handwashing) according to CDC guidelines.  7. The patient or parent/caregiver has the option to accept or refuse treatment. 8. Patient medication history was reviewed for potential drug interactions:Interaction with home meds: birth control. Told her to use extra protection if sexually active 9. Patient's GFR was calculated to be >60, and they were therefore prescribed Normal dose (GFR>60) - nirmatrelvir 143m tab (2 tablet) by mouth twice daily AND ritonavir 1040mtab (1 tablet) by mouth twice daily   After reviewing above information with the patient, the patient agrees to receive Paxlovid.  Follow up instructions:    . Take prescription BID x 5 days as directed . Reach out to pharmacist for counseling on medication if desired . For concerns regarding further COVID symptoms please follow up with your PCP or urgent care . For urgent or life-threatening issues, seek care at your local emergency department  The patient was provided an opportunity to ask questions, and all were answered. The patient agreed with the plan and demonstrated an understanding of the instructions.   [facility] [address] [place] was advised to call their PCP or seek an in-person evaluation if the symptoms worsen or if the condition fails to improve as anticipated.   I provided 10 minutes of non face-to-face telephone visit time during this encounter, and > 50% was spent counseling as documented under my assessment & plan.  KaAngelena FormPA-C 10/18/2020 /12:55 PM

## 2020-10-23 ENCOUNTER — Encounter (HOSPITAL_COMMUNITY): Payer: Self-pay

## 2020-10-23 ENCOUNTER — Other Ambulatory Visit: Payer: Self-pay

## 2020-10-23 ENCOUNTER — Ambulatory Visit (HOSPITAL_COMMUNITY)
Admission: EM | Admit: 2020-10-23 | Discharge: 2020-10-23 | Disposition: A | Discharge status: Home / Self Care | Payer: No Typology Code available for payment source | Attending: Student | Admitting: Student

## 2020-10-23 DIAGNOSIS — S6412XA Injury of median nerve at wrist and hand level of left arm, initial encounter: Secondary | ICD-10-CM

## 2020-10-23 DIAGNOSIS — U071 COVID-19: Secondary | ICD-10-CM

## 2020-10-23 MED ORDER — PREDNISONE 20 MG PO TABS: 40.0000 mg | ORAL_TABLET | Freq: Every day | ORAL | 0 refills | Status: AC

## 2020-10-23 NOTE — ED Triage Notes (Signed)
Pt in with c/o left wrist pain that started on saturday after she went to ED and a nurse attempted to insert an IV.  Pt states she is now having pain and numbness in her palm and fingers

## 2020-10-23 NOTE — ED Provider Notes (Signed)
[facility]    CSN: [id number] Arrival date & time: 10/23/20  1213      History   Chief Complaint Chief Complaint  Patient presents with  . Wrist Pain  . Numbness    HPI Shelby Burke is a 25 y.o. female presenting with L hand tingling and wrist pain following IV placement 5 days ago. This patient was in the ED 4/2 for chest pain and was ultimately diagnosed with Covid-19. They attempted IV placement L wrist and patient states she felt significant pain when the needle went in. Since then, decreased sensation and tingling over middle and 4th fingers, along with some wrist pain. She is right handed.  States she is feeling much better after the Covid diagnosis. Today denies fevers/chills, n/v/d, shortness of breath, chest pain, cough, congestion, facial pain, teeth pain, headaches, sore throat, loss of taste/smell, swollen lymph nodes, ear pain.    HPI  Past Medical History:  Diagnosis Date  . Asthma   . Overweight (BMI 25.0-29.9)   . Urticaria     Patient Active Problem List   Diagnosis Date Noted  . Overweight (BMI 25.0-29.9)   . Other allergic rhinitis 07/31/2019  . Frequent sinus infections 07/31/2019  . Pruritic rash 07/31/2019  . Mild intermittent asthma without complication 07/31/2019    Past Surgical History:  Procedure Laterality Date  . ADENOIDECTOMY    . BREAST REDUCTION SURGERY  2019    OB History   No obstetric history on file.      Home Medications    Prior to Admission medications   Medication Sig Start Date End Date Taking? Authorizing Provider  predniSONE (DELTASONE) 20 MG tablet Take 2 tablets (40 mg total) by mouth daily for 5 days. 10/23/20 10/28/20 Yes Rhys Martini, PA-C  albuterol (VENTOLIN HFA) 108 (90 Base) MCG/ACT inhaler Inhale 2 puffs into the lungs every 6 (six) hours as needed for wheezing or shortness of breath. 06/01/19   [provider]  aspirin EC 325 MG tablet Take 325 mg by mouth every 6 (six) hours as  needed for moderate pain.    [provider]  doxycycline (VIBRAMYCIN) 100 MG capsule Take 1 capsule (100 mg total) by mouth 2 (two) times daily. Patient not taking: No sig reported 06/19/18   Army Melia A, PA-C  escitalopram (LEXAPRO) 10 MG tablet Take 1 tablet by mouth daily. 08/31/20   [provider]  fluticasone (FLONASE) 50 MCG/ACT nasal spray Place 1 spray into both nostrils daily. 06/19/18   Jeannie Fend, PA-C  levocetirizine (XYZAL) 5 MG tablet Take 5 mg by mouth daily. 05/26/19   [provider]  Multiple Vitamin (MULTIVITAMIN ADULT) TABS Take 1 tablet by mouth daily.    [provider]  nirmatrelvir/ritonavir EUA (PAXLOVID) TABS Take 3 tablets by mouth 2 (two) times daily for 5 days. Take nirmatrelvir (150 mg) two tablet(s) twice daily for 5 days and ritonavir (100 mg) one tablet twice daily for 5 days. 10/18/20 10/23/20  Janetta Hora, PA-C  Probiotic Product (PROBIOTIC PO) Take 1 capsule by mouth daily.    [provider]  TRI-LO-MARZIA 0.18/0.215/0.25 MG-25 MCG tab Take 1 tablet by mouth daily. 05/25/19   [provider]    Family History Family History  Problem Relation Age of Onset  . Asthma Mother   . Allergic rhinitis Mother   . Allergic rhinitis Father     Social History Social History   Tobacco Use  . Smoking status: Current Some  Day Smoker  . Smokeless tobacco: Never Used  Vaping Use  . Vaping Use: Never used  Substance Use Topics  . Alcohol use: Yes    Comment: once or twice a month  . Drug use: No     Allergies   Cats claw (uncaria tomentosa), Dog epithelium allergy skin test, American cockroach allergy skin test, Dust mite extract, Grass extracts [gramineae pollens], Molds & smuts, Pollen extract, and Short ragweed pollen ext   Review of Systems Review of Systems  Musculoskeletal:       L wrist pain and finger tingling  All other systems reviewed and are negative.    Physical Exam Triage  Vital Signs ED Triage Vitals  Enc Vitals Group     BP 10/23/20 1318 125/82     Pulse Rate 10/23/20 1318 81     Resp 10/23/20 1318 18     Temp 10/23/20 1318 99.7 F (37.6 C)     Temp src --      SpO2 10/23/20 1318 96 %     Weight --      Height --      Head Circumference --      Peak Flow --      Pain Score 10/23/20 1317 2     Pain Loc --      Pain Edu? --      Excl. in GC? --    No data found.  Updated Vital Signs BP 125/82   Pulse 81   Temp 99.7 F (37.6 C)   Resp 18   LMP 10/14/2020   SpO2 96%   Visual Acuity Right Eye Distance:   Left Eye Distance:   Bilateral Distance:    Right Eye Near:   Left Eye Near:    Bilateral Near:     Physical Exam Vitals reviewed.  Constitutional:      Appearance: Normal appearance.  HENT:     Head: Normocephalic and atraumatic.  Cardiovascular:     Rate and Rhythm: Normal rate and regular rhythm.     Heart sounds: Normal heart sounds.  Pulmonary:     Effort: Pulmonary effort is normal.     Breath sounds: Normal breath sounds.  Musculoskeletal:     Comments: L wrist- faint ecchymosis over volar aspect. Grip strength 5/5, sensation intact, cap refill <2 seconds, radial pulse 2+. Positive phalen sign.   Skin:    Capillary Refill: Capillary refill takes less than 2 seconds.  Neurological:     General: No focal deficit present.     Mental Status: She is alert and oriented to person, place, and time.  Psychiatric:        Mood and Affect: Mood normal.        Behavior: Behavior normal.        Thought Content: Thought content normal.        Judgment: Judgment normal.      UC Treatments / Results  Labs (all labs ordered are listed, but only abnormal results are displayed) Labs Reviewed - No data to display  EKG   Radiology No results found.  Procedures Procedures (including critical care time)  Medications Ordered in UC Medications - No data to display  Initial Impression / Assessment and Plan / UC Course  I  have reviewed the triage vital signs and the nursing notes.  Pertinent labs & imaging results that were available during my care of the patient were reviewed by me and considered in my medical decision making (see  chart for details).     This patient is a 25 year old female presenting with median nerve injury following IV placement.  Neurovascularly intact.  Prednisone sent as below.  Follow-up with hand specialist if symptoms persist.  ED return precautions discussed.  Final Clinical Impressions(s) / UC Diagnoses   Final diagnoses:  Injury of left median nerve at wrist, initial encounter  COVID-19     Discharge Instructions     -Start the prednisone, 2 pills taken together in the morning for 5 days. -Take Tylenol 1000 mg 3 times daily, and ibuprofen 800 mg 3 times daily with food.  You can take these together, or alternate every 3-4 hours. -Follow-up with hand specialist if symptoms persist past 2 to 3 weeks, information below.   ED Prescriptions    Medication Sig Dispense Auth. Provider   predniSONE (DELTASONE) 20 MG tablet Take 2 tablets (40 mg total) by mouth daily for 5 days. 10 tablet Rhys Martini, PA-C     PDMP not reviewed this encounter.   Rhys Martini, PA-C 10/23/20 1503

## 2020-10-23 NOTE — Discharge Instructions (Signed)
-  Start the prednisone, 2 pills taken together in the morning for 5 days. -Take Tylenol 1000 mg 3 times daily, and ibuprofen 800 mg 3 times daily with food.  You can take these together, or alternate every 3-4 hours. -Follow-up with hand specialist if symptoms persist past 2 to 3 weeks, information below.

## 2020-10-28 ENCOUNTER — Ambulatory Visit (INDEPENDENT_AMBULATORY_CARE_PROVIDER_SITE_OTHER): Payer: No Typology Code available for payment source

## 2020-10-28 DIAGNOSIS — J309 Allergic rhinitis, unspecified: Secondary | ICD-10-CM | POA: Diagnosis not present

## 2020-11-11 ENCOUNTER — Ambulatory Visit (INDEPENDENT_AMBULATORY_CARE_PROVIDER_SITE_OTHER): Payer: No Typology Code available for payment source

## 2020-11-11 DIAGNOSIS — J309 Allergic rhinitis, unspecified: Secondary | ICD-10-CM

## 2020-11-18 ENCOUNTER — Ambulatory Visit (INDEPENDENT_AMBULATORY_CARE_PROVIDER_SITE_OTHER): Payer: No Typology Code available for payment source

## 2020-11-18 DIAGNOSIS — J309 Allergic rhinitis, unspecified: Secondary | ICD-10-CM

## 2020-12-01 ENCOUNTER — Telehealth: Payer: Self-pay | Admitting: *Deleted

## 2020-12-01 ENCOUNTER — Ambulatory Visit (INDEPENDENT_AMBULATORY_CARE_PROVIDER_SITE_OTHER): Payer: No Typology Code available for payment source | Admitting: *Deleted

## 2020-12-01 DIAGNOSIS — J309 Allergic rhinitis, unspecified: Secondary | ICD-10-CM | POA: Diagnosis not present

## 2020-12-01 NOTE — Telephone Encounter (Signed)
Request for patient's allergy vials to be re-ordered has been faxed to [phone] at [facility].

## 2020-12-09 ENCOUNTER — Ambulatory Visit (INDEPENDENT_AMBULATORY_CARE_PROVIDER_SITE_OTHER): Payer: No Typology Code available for payment source

## 2020-12-09 DIAGNOSIS — J309 Allergic rhinitis, unspecified: Secondary | ICD-10-CM

## 2020-12-16 ENCOUNTER — Ambulatory Visit: Payer: Self-pay

## 2020-12-22 ENCOUNTER — Ambulatory Visit (INDEPENDENT_AMBULATORY_CARE_PROVIDER_SITE_OTHER): Payer: No Typology Code available for payment source | Admitting: *Deleted

## 2020-12-22 DIAGNOSIS — J309 Allergic rhinitis, unspecified: Secondary | ICD-10-CM | POA: Diagnosis not present

## 2020-12-30 ENCOUNTER — Ambulatory Visit (INDEPENDENT_AMBULATORY_CARE_PROVIDER_SITE_OTHER): Payer: No Typology Code available for payment source

## 2020-12-30 DIAGNOSIS — J309 Allergic rhinitis, unspecified: Secondary | ICD-10-CM | POA: Diagnosis not present

## 2021-01-14 ENCOUNTER — Ambulatory Visit (INDEPENDENT_AMBULATORY_CARE_PROVIDER_SITE_OTHER): Payer: No Typology Code available for payment source | Admitting: *Deleted

## 2021-01-14 DIAGNOSIS — J309 Allergic rhinitis, unspecified: Secondary | ICD-10-CM

## 2021-01-22 ENCOUNTER — Ambulatory Visit (INDEPENDENT_AMBULATORY_CARE_PROVIDER_SITE_OTHER): Payer: No Typology Code available for payment source

## 2021-01-22 DIAGNOSIS — J309 Allergic rhinitis, unspecified: Secondary | ICD-10-CM | POA: Diagnosis not present

## 2021-02-04 ENCOUNTER — Ambulatory Visit (INDEPENDENT_AMBULATORY_CARE_PROVIDER_SITE_OTHER): Payer: No Typology Code available for payment source | Admitting: *Deleted

## 2021-02-04 DIAGNOSIS — J309 Allergic rhinitis, unspecified: Secondary | ICD-10-CM | POA: Diagnosis not present

## 2021-02-11 ENCOUNTER — Encounter (HOSPITAL_COMMUNITY): Payer: Self-pay | Admitting: Emergency Medicine

## 2021-02-11 ENCOUNTER — Ambulatory Visit (HOSPITAL_COMMUNITY)
Admission: EM | Admit: 2021-02-11 | Discharge: 2021-02-11 | Disposition: A | Discharge status: Home / Self Care | Payer: No Typology Code available for payment source | Attending: Internal Medicine | Admitting: Internal Medicine

## 2021-02-11 ENCOUNTER — Other Ambulatory Visit: Payer: Self-pay

## 2021-02-11 DIAGNOSIS — Z113 Encounter for screening for infections with a predominantly sexual mode of transmission: Secondary | ICD-10-CM | POA: Diagnosis present

## 2021-02-11 DIAGNOSIS — R300 Dysuria: Secondary | ICD-10-CM | POA: Insufficient documentation

## 2021-02-11 DIAGNOSIS — N3001 Acute cystitis with hematuria: Secondary | ICD-10-CM | POA: Diagnosis present

## 2021-02-11 DIAGNOSIS — N898 Other specified noninflammatory disorders of vagina: Secondary | ICD-10-CM | POA: Diagnosis present

## 2021-02-11 DIAGNOSIS — R350 Frequency of micturition: Secondary | ICD-10-CM | POA: Diagnosis present

## 2021-02-11 LAB — POCT URINALYSIS DIPSTICK, ED / UC
Bilirubin Urine: NEGATIVE
Glucose, UA: NEGATIVE mg/dL
Ketones, ur: NEGATIVE mg/dL
Nitrite: NEGATIVE
Protein, ur: 30 mg/dL — AB
Specific Gravity, Urine: >=1.03 (ref 1.005–1.030)
Urobilinogen, UA: 0.2 mg/dL (ref 0.0–1.0)
pH: 6 (ref 5.0–8.0)

## 2021-02-11 LAB — POC URINE PREG, ED: Preg Test, Ur: NEGATIVE

## 2021-02-11 MED ORDER — NITROFURANTOIN MONOHYD MACRO 100 MG PO CAPS: 100.0000 mg | ORAL_CAPSULE | Freq: Two times a day (BID) | ORAL | 0 refills | Status: AC

## 2021-02-11 NOTE — Discharge Instructions (Addendum)
You are being treated for urinary tract infection with Macrobid antibiotic.  Your urine culture and STD vaginal swab are pending.  We will call if these are positive.  Please increase clear oral fluid intake.  Please go to the hospital if pelvic pain develops or if abdominal pain significantly worsens and you develop fever.

## 2021-02-11 NOTE — ED Triage Notes (Signed)
Pt presents today with c/o of dysuria, +urine frequency/hesitation x 3 days. +lower abd pain, denies n/v/d, no vaginal discharge.Afebrile

## 2021-02-11 NOTE — ED Provider Notes (Signed)
[facility]    CSN: [id number] Arrival date & time: 02/11/21  1507      History   Chief Complaint Chief Complaint  Patient presents with   Dysuria   Abdominal Pain    HPI Shelby Burke is a 25 y.o. female.   Patient presents to urgent care with 3-day history of urinary burning, frequency, hesitation.  Does have some mild lower abdominal pain but denies pelvic pain.  Denies any nausea, vomiting, diarrhea, fever.  Patient states that he does have some vaginal discharge that has been present for multiple years and is unchanged from her normal discharge.  Denies any known STD exposure.   Dysuria Abdominal Pain  Past Medical History:  Diagnosis Date   Asthma    Overweight (BMI 25.0-29.9)    Urticaria     Patient Active Problem List   Diagnosis Date Noted   Overweight (BMI 25.0-29.9)    Other allergic rhinitis 07/31/2019   Frequent sinus infections 07/31/2019   Pruritic rash 07/31/2019   Mild intermittent asthma without complication 07/31/2019    Past Surgical History:  Procedure Laterality Date   ADENOIDECTOMY     BREAST REDUCTION SURGERY  2019    OB History   No obstetric history on file.      Home Medications    Prior to Admission medications   Medication Sig Start Date End Date Taking? Authorizing Provider  nitrofurantoin, macrocrystal-monohydrate, (MACROBID) 100 MG capsule Take 1 capsule (100 mg total) by mouth 2 (two) times daily for 7 days. 02/11/21 02/18/21 Yes Lance Muss, FNP  albuterol (VENTOLIN HFA) 108 (90 Base) MCG/ACT inhaler Inhale 2 puffs into the lungs every 6 (six) hours as needed for wheezing or shortness of breath. 06/01/19   [provider]  aspirin EC 325 MG tablet Take 325 mg by mouth every 6 (six) hours as needed for moderate pain.    [provider]  doxycycline (VIBRAMYCIN) 100 MG capsule Take 1 capsule (100 mg total) by mouth 2 (two) times daily. Patient not taking: No sig reported 06/19/18   Army Melia A, PA-C  escitalopram (LEXAPRO) 10 MG tablet Take 1 tablet by mouth daily. 08/31/20   [provider]  fluticasone (FLONASE) 50 MCG/ACT nasal spray Place 1 spray into both nostrils daily. 06/19/18   Jeannie Fend, PA-C  levocetirizine (XYZAL) 5 MG tablet Take 5 mg by mouth daily. 05/26/19   [provider]  Multiple Vitamin (MULTIVITAMIN ADULT) TABS Take 1 tablet by mouth daily.    [provider]  Probiotic Product (PROBIOTIC PO) Take 1 capsule by mouth daily.    [provider]  TRI-LO-MARZIA 0.18/0.215/0.25 MG-25 MCG tab Take 1 tablet by mouth daily. 05/25/19   [provider]    Family History Family History  Problem Relation Age of Onset   Asthma Mother    Allergic rhinitis Mother    Allergic rhinitis Father     Social History Social History   Tobacco Use   Smoking status: Some Days   Smokeless tobacco: Never  Vaping Use   Vaping Use: Never used  Substance Use Topics   Alcohol use: Yes    Comment: once or twice a month   Drug use: No     Allergies   Cats claw (uncaria tomentosa), Dog epithelium allergy skin test, American cockroach allergy skin test, Dust mite extract, Grass extracts [gramineae pollens], Molds & smuts, Pollen extract, and Short ragweed pollen ext   Review of Systems Review of  Systems Per HPI  Physical Exam Triage Vital Signs ED Triage Vitals  Enc Vitals Group     BP 02/11/21 1532 120/82     Pulse Rate 02/11/21 1532 77     Resp 02/11/21 1532 18     Temp 02/11/21 1532 98.6 F (37 C)     Temp Source 02/11/21 1532 Oral     SpO2 02/11/21 1532 98 %     Weight --      Height --      Head Circumference --      Peak Flow --      Pain Score 02/11/21 1530 2     Pain Loc --      Pain Edu? --      Excl. in GC? --    No data found.  Updated Vital Signs BP 120/82 (BP Location: Right Arm)   Pulse 77   Temp 98.6 F (37 C) (Oral)   Resp 18   LMP 12/29/2020   SpO2 98%   Visual Acuity Right Eye  Distance:   Left Eye Distance:   Bilateral Distance:    Right Eye Near:   Left Eye Near:    Bilateral Near:     Physical Exam Constitutional:      Appearance: Normal appearance.  HENT:     Head: Normocephalic and atraumatic.  Eyes:     Extraocular Movements: Extraocular movements intact.     Conjunctiva/sclera: Conjunctivae normal.  Cardiovascular:     Rate and Rhythm: Normal rate and regular rhythm.     Heart sounds: Normal heart sounds.  Pulmonary:     Effort: Pulmonary effort is normal.     Breath sounds: Normal breath sounds.  Abdominal:     General: Abdomen is flat. There is no distension.     Tenderness: There is no abdominal tenderness.  Genitourinary:    Comments: Deferred with shared decision making.  Self swab performed. Skin:    General: Skin is warm and dry.  Neurological:     General: No focal deficit present.     Mental Status: She is alert and oriented to person, place, and time. Mental status is at baseline.  Psychiatric:        Mood and Affect: Mood normal.        Behavior: Behavior normal.        Thought Content: Thought content normal.        Judgment: Judgment normal.     UC Treatments / Results  Labs (all labs ordered are listed, but only abnormal results are displayed) Labs Reviewed  POCT URINALYSIS DIPSTICK, ED / UC - Abnormal; Notable for the following components:      Result Value   Hgb urine dipstick LARGE (*)    Protein, ur 30 (*)    Leukocytes,Ua TRACE (*)    All other components within normal limits  URINE CULTURE  POC URINE PREG, ED  CERVICOVAGINAL ANCILLARY ONLY    EKG   Radiology No results found.  Procedures Procedures (including critical care time)  Medications Ordered in UC Medications - No data to display  Initial Impression / Assessment and Plan / UC Course  I have reviewed the triage vital signs and the nursing notes.  Pertinent labs & imaging results that were available during my care of the patient were  reviewed by me and considered in my medical decision making (see chart for details).     Urinalysis showing signs of urinary tract infection, so will treat with  Macrobid x7 days.  Urine culture pending.  STD vaginal swab pending due to vaginal discharge although low suspicion for STD.  Patient to increase clear oral fluid intake.  Advised patient to go to the hospital if fever develops or if abdominal pain worsens.Discussed strict return precautions. Patient verbalized understanding and is agreeable with plan.  Final Clinical Impressions(s) / UC Diagnoses   Final diagnoses:  Acute cystitis with hematuria  Dysuria  Urinary frequency  Vaginal discharge  Screening for venereal disease     Discharge Instructions      You are being treated for urinary tract infection with Macrobid antibiotic.  Your urine culture and STD vaginal swab are pending.  We will call if these are positive.  Please increase clear oral fluid intake.  Please go to the hospital if pelvic pain develops or if abdominal pain significantly worsens and you develop fever.     ED Prescriptions     Medication Sig Dispense Auth. Provider   nitrofurantoin, macrocrystal-monohydrate, (MACROBID) 100 MG capsule Take 1 capsule (100 mg total) by mouth 2 (two) times daily for 7 days. 14 capsule Lance Muss, FNP      PDMP not reviewed this encounter.   Lance Muss, FNP 02/11/21 [phone]

## 2021-02-12 ENCOUNTER — Telehealth (HOSPITAL_COMMUNITY): Payer: Self-pay | Admitting: Emergency Medicine

## 2021-02-12 LAB — CERVICOVAGINAL ANCILLARY ONLY
Bacterial Vaginitis (gardnerella): NEGATIVE
Candida Glabrata: NEGATIVE
Candida Vaginitis: POSITIVE — AB
Chlamydia: NEGATIVE
Comment: NEGATIVE
Comment: NEGATIVE
Comment: NEGATIVE
Comment: NEGATIVE
Comment: NEGATIVE
Comment: NORMAL
Neisseria Gonorrhea: NEGATIVE
Trichomonas: NEGATIVE

## 2021-02-12 MED ORDER — FLUCONAZOLE 150 MG PO TABS: 150.0000 mg | ORAL_TABLET | Freq: Once | ORAL | 0 refills | Status: AC

## 2021-02-14 LAB — URINE CULTURE: Culture: >=100000 — AB

## 2021-02-18 ENCOUNTER — Telehealth (HOSPITAL_COMMUNITY): Payer: Self-pay | Admitting: Emergency Medicine

## 2021-02-18 MED ORDER — FLUCONAZOLE 150 MG PO TABS: 150.0000 mg | ORAL_TABLET | Freq: Once | ORAL | 0 refills | Status: AC

## 2021-02-18 NOTE — Telephone Encounter (Signed)
Patient wanted prescription sent to different pharmacy 

## 2021-02-23 ENCOUNTER — Ambulatory Visit (INDEPENDENT_AMBULATORY_CARE_PROVIDER_SITE_OTHER): Payer: No Typology Code available for payment source | Admitting: *Deleted

## 2021-02-23 DIAGNOSIS — J309 Allergic rhinitis, unspecified: Secondary | ICD-10-CM | POA: Diagnosis not present

## 2021-03-04 ENCOUNTER — Ambulatory Visit (INDEPENDENT_AMBULATORY_CARE_PROVIDER_SITE_OTHER): Payer: No Typology Code available for payment source | Admitting: *Deleted

## 2021-03-04 DIAGNOSIS — J309 Allergic rhinitis, unspecified: Secondary | ICD-10-CM | POA: Diagnosis not present

## 2021-03-17 ENCOUNTER — Ambulatory Visit (INDEPENDENT_AMBULATORY_CARE_PROVIDER_SITE_OTHER): Payer: No Typology Code available for payment source

## 2021-03-17 DIAGNOSIS — J309 Allergic rhinitis, unspecified: Secondary | ICD-10-CM

## 2021-03-29 ENCOUNTER — Telehealth: Payer: Self-pay | Admitting: *Deleted

## 2021-03-29 NOTE — Telephone Encounter (Signed)
Vial refill request has been faxed to [facility] at [phone] and [phone].

## 2021-04-01 ENCOUNTER — Ambulatory Visit (INDEPENDENT_AMBULATORY_CARE_PROVIDER_SITE_OTHER): Payer: No Typology Code available for payment source | Admitting: *Deleted

## 2021-04-01 DIAGNOSIS — J309 Allergic rhinitis, unspecified: Secondary | ICD-10-CM

## 2021-04-14 ENCOUNTER — Ambulatory Visit (INDEPENDENT_AMBULATORY_CARE_PROVIDER_SITE_OTHER): Payer: No Typology Code available for payment source | Admitting: *Deleted

## 2021-04-14 DIAGNOSIS — J309 Allergic rhinitis, unspecified: Secondary | ICD-10-CM | POA: Diagnosis not present

## 2021-04-14 NOTE — Telephone Encounter (Signed)
Received allergy vials and new paperwork. Previous injection record has been labeled and placed in bulk scanning. Called patient and informed that new vials are in. Patient verbalized understanding.

## 2021-05-19 ENCOUNTER — Ambulatory Visit (INDEPENDENT_AMBULATORY_CARE_PROVIDER_SITE_OTHER): Payer: No Typology Code available for payment source

## 2021-05-19 DIAGNOSIS — J309 Allergic rhinitis, unspecified: Secondary | ICD-10-CM | POA: Diagnosis not present

## 2021-06-03 ENCOUNTER — Ambulatory Visit (INDEPENDENT_AMBULATORY_CARE_PROVIDER_SITE_OTHER): Payer: No Typology Code available for payment source

## 2021-06-03 DIAGNOSIS — J309 Allergic rhinitis, unspecified: Secondary | ICD-10-CM | POA: Diagnosis not present

## 2021-06-04 IMAGING — CR DG CHEST 2V
2 series · 2 of 2 positions shown · non-contrast
Comparison: None.

CLINICAL DATA: Chest pain, dyspnea, nausea

EXAM:
CHEST - 2 VIEW

[chest pa]
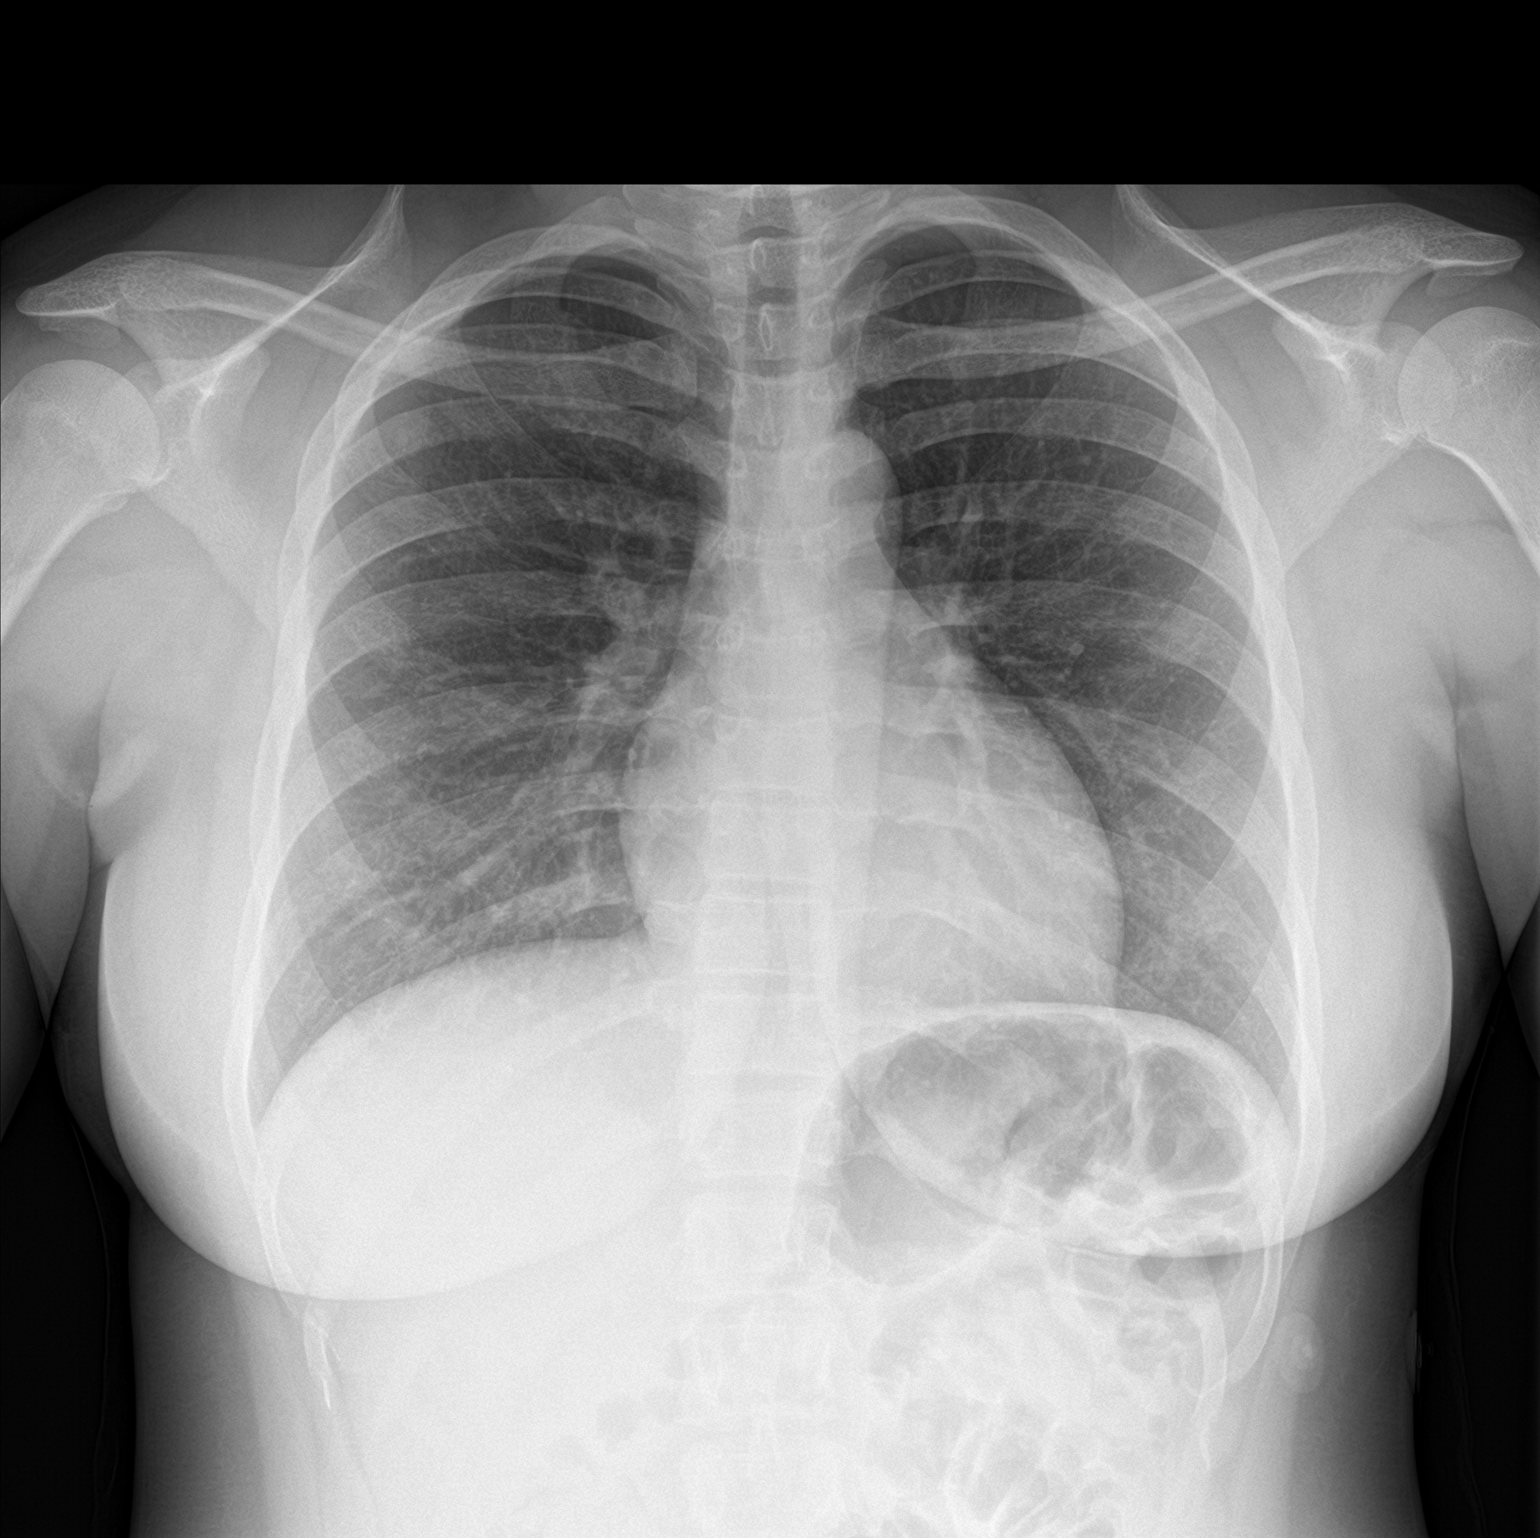

[chest lat]
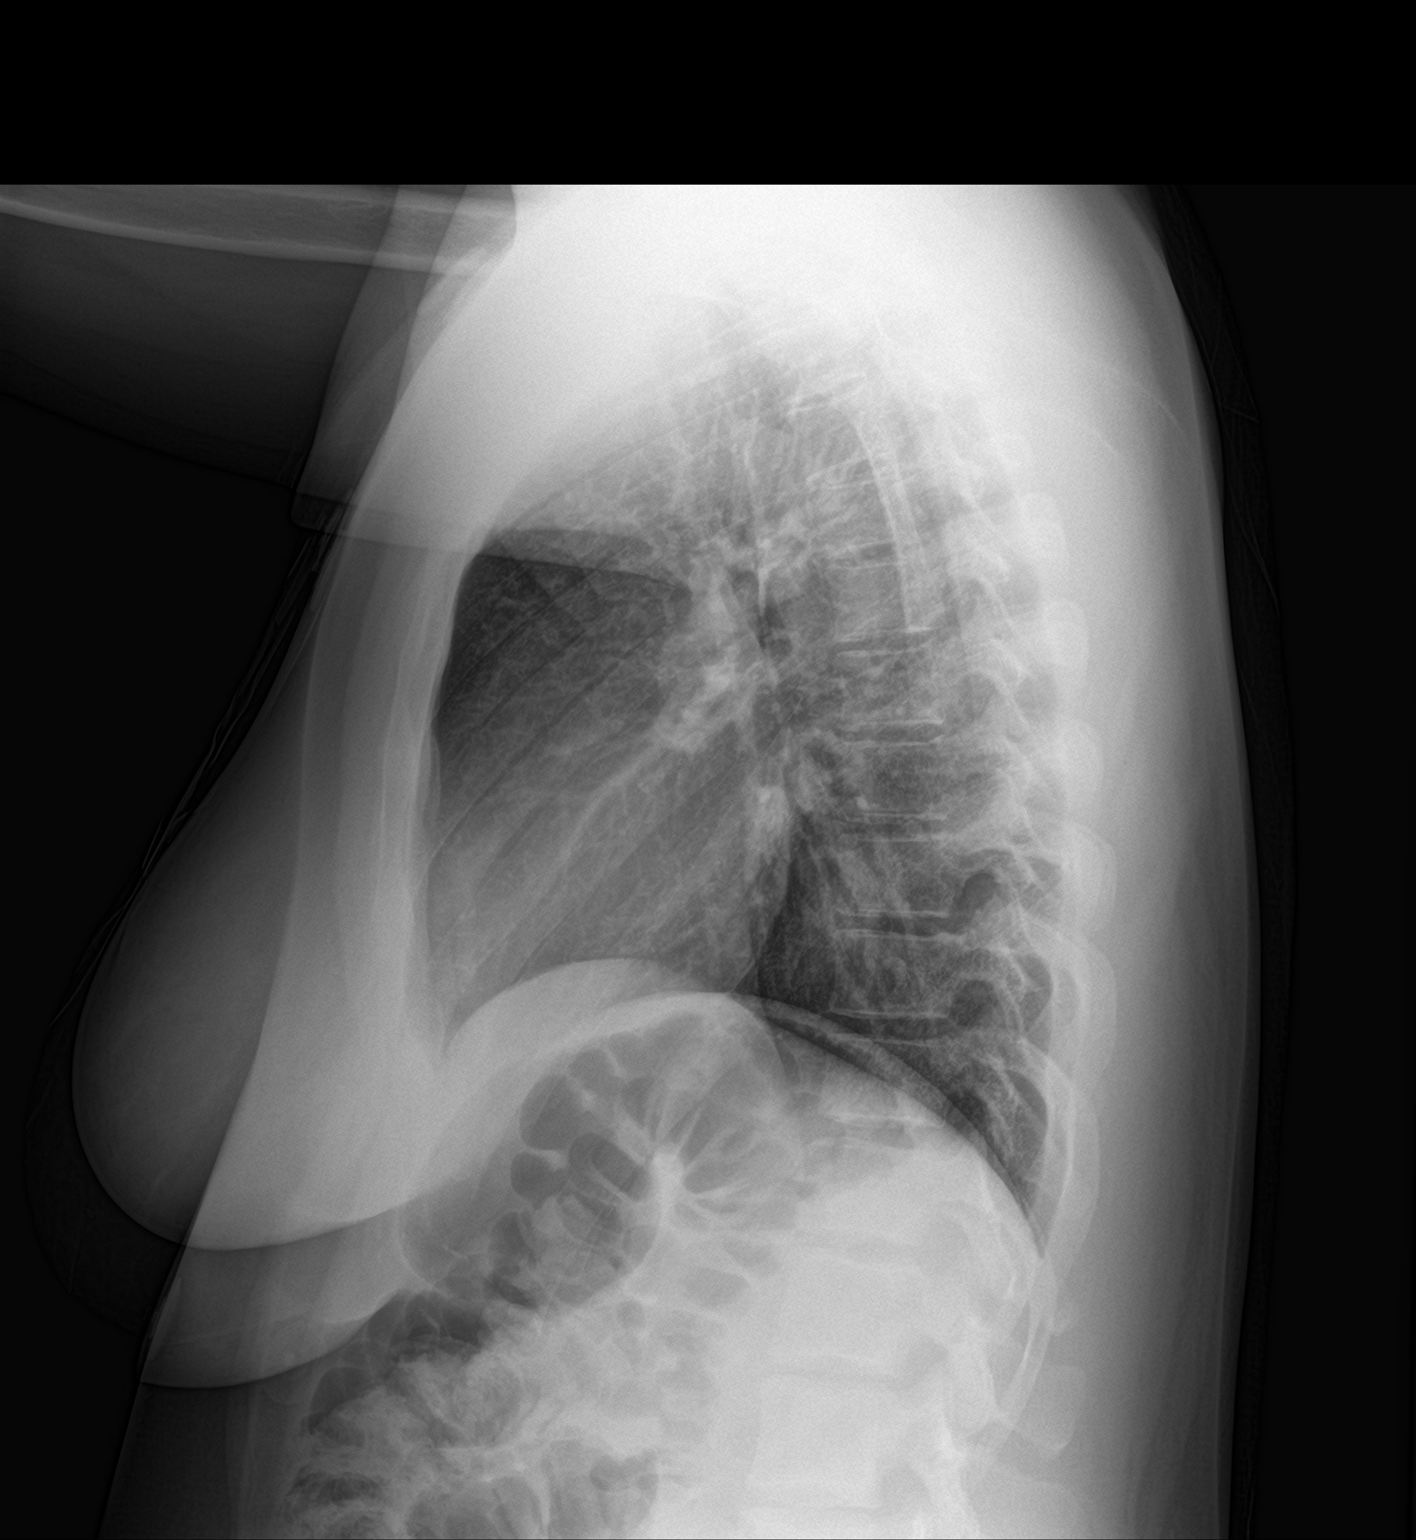

[2 of 2 positions shown; findings below may reference images not displayed]

FINDINGS: Normal heart size. Normal mediastinal contour. No pneumothorax. No
pleural effusion. Lungs appear clear, with no acute consolidative
airspace disease and no pulmonary edema.
IMPRESSION: No active cardiopulmonary disease.

## 2021-06-17 ENCOUNTER — Ambulatory Visit (INDEPENDENT_AMBULATORY_CARE_PROVIDER_SITE_OTHER): Payer: No Typology Code available for payment source | Admitting: *Deleted

## 2021-06-17 DIAGNOSIS — J309 Allergic rhinitis, unspecified: Secondary | ICD-10-CM | POA: Diagnosis not present

## 2021-06-29 ENCOUNTER — Ambulatory Visit (INDEPENDENT_AMBULATORY_CARE_PROVIDER_SITE_OTHER): Payer: No Typology Code available for payment source | Admitting: *Deleted

## 2021-06-29 DIAGNOSIS — J309 Allergic rhinitis, unspecified: Secondary | ICD-10-CM | POA: Diagnosis not present

## 2021-07-15 ENCOUNTER — Ambulatory Visit (INDEPENDENT_AMBULATORY_CARE_PROVIDER_SITE_OTHER): Payer: No Typology Code available for payment source | Admitting: *Deleted

## 2021-07-15 DIAGNOSIS — J309 Allergic rhinitis, unspecified: Secondary | ICD-10-CM | POA: Diagnosis not present

## 2021-07-26 ENCOUNTER — Ambulatory Visit (INDEPENDENT_AMBULATORY_CARE_PROVIDER_SITE_OTHER): Payer: No Typology Code available for payment source | Admitting: *Deleted

## 2021-07-26 DIAGNOSIS — J309 Allergic rhinitis, unspecified: Secondary | ICD-10-CM | POA: Diagnosis not present

## 2021-08-12 ENCOUNTER — Ambulatory Visit (INDEPENDENT_AMBULATORY_CARE_PROVIDER_SITE_OTHER): Payer: No Typology Code available for payment source

## 2021-08-12 DIAGNOSIS — J309 Allergic rhinitis, unspecified: Secondary | ICD-10-CM

## 2021-08-19 ENCOUNTER — Ambulatory Visit (INDEPENDENT_AMBULATORY_CARE_PROVIDER_SITE_OTHER): Payer: No Typology Code available for payment source

## 2021-08-19 DIAGNOSIS — J309 Allergic rhinitis, unspecified: Secondary | ICD-10-CM

## 2021-09-01 ENCOUNTER — Ambulatory Visit (INDEPENDENT_AMBULATORY_CARE_PROVIDER_SITE_OTHER): Payer: No Typology Code available for payment source

## 2021-09-01 DIAGNOSIS — J309 Allergic rhinitis, unspecified: Secondary | ICD-10-CM | POA: Diagnosis not present

## 2021-09-22 ENCOUNTER — Ambulatory Visit (INDEPENDENT_AMBULATORY_CARE_PROVIDER_SITE_OTHER): Payer: No Typology Code available for payment source

## 2021-09-22 DIAGNOSIS — J309 Allergic rhinitis, unspecified: Secondary | ICD-10-CM

## 2021-09-23 ENCOUNTER — Telehealth: Payer: Self-pay

## 2021-09-23 NOTE — Telephone Encounter (Signed)
Patients vials from [facility] came in today. I have placed her new vials I the tray and placed her papers with vial procedures and schedule in the Book for Outside immunotherapy patients.  ?

## 2022-01-15 ENCOUNTER — Encounter (HOSPITAL_COMMUNITY): Payer: Self-pay | Admitting: Emergency Medicine

## 2022-01-15 ENCOUNTER — Emergency Department (HOSPITAL_COMMUNITY)
Admission: EM | Admit: 2022-01-15 | Discharge: 2022-01-15 | Disposition: A | Discharge status: Home / Self Care | Payer: No Typology Code available for payment source | Attending: Emergency Medicine | Admitting: Emergency Medicine

## 2022-01-15 ENCOUNTER — Other Ambulatory Visit: Payer: Self-pay

## 2022-01-15 DIAGNOSIS — R21 Rash and other nonspecific skin eruption: Secondary | ICD-10-CM | POA: Diagnosis present

## 2022-01-15 DIAGNOSIS — L509 Urticaria, unspecified: Secondary | ICD-10-CM | POA: Diagnosis not present

## 2022-01-15 MED ORDER — FAMOTIDINE 20 MG PO TABS
20.0000 mg | ORAL_TABLET | Freq: Once | ORAL | Status: AC
  Administered 2022-01-15: 20 mg via ORAL
  Filled 2022-01-15: qty 1

## 2022-01-15 MED ORDER — METHYLPREDNISOLONE SODIUM SUCC 125 MG IJ SOLR
125.0000 mg | Freq: Once | INTRAMUSCULAR | Status: AC
  Administered 2022-01-15: 125 mg via INTRAMUSCULAR
  Filled 2022-01-15: qty 2

## 2022-01-15 MED ORDER — PREDNISONE 10 MG (21) PO TBPK: ORAL_TABLET | Freq: Every day | ORAL | 0 refills | Status: DC

## 2022-01-15 NOTE — Discharge Instructions (Signed)
Take prednisone as prescribed starting tomorrow. Zyrtec daily.  Can take Benadryl as directed.  Recommend Pepcid as directed for the next few days.  Recheck with your primary care provider. Return to ER for worsening or concerning symptoms.  Avoid hot showers as the hot water may exacerbate your symptoms.

## 2022-01-15 NOTE — ED Provider Notes (Signed)
[facility] EMERGENCY DEPARTMENT Provider Note   CSN: [id number] Arrival date & time: 01/15/22  1029     History  Chief Complaint  Patient presents with   Urticaria    Shelby Burke is a 26 y.o. female.  26 year old female presents with concern for rash and itching.  Patient states that her scalp started to itch last night, thought that it was dry and went to bed.  Patient woke up with persistently itchy scalp with hives to her arms, trunk, legs.  Patient took Benadryl at 9 AM today without improvement in the rash or her itching.  Denies new soaps, lotions, detergents. Denies difficulty breathing, swallowing, vomiting.        Home Medications Prior to Admission medications   Medication Sig Start Date End Date Taking? Authorizing Provider  predniSONE (STERAPRED UNI-PAK 21 TAB) 10 MG (21) TBPK tablet Take by mouth daily. Take 6 tabs by mouth daily  for 2 days, then 5 tabs for 2 days, then 4 tabs for 2 days, then 3 tabs for 2 days, 2 tabs for 2 days, then 1 tab by mouth daily for 2 days 01/15/22  Yes Jeannie Fend, PA-C  albuterol (VENTOLIN HFA) 108 (90 Base) MCG/ACT inhaler Inhale 2 puffs into the lungs every 6 (six) hours as needed for wheezing or shortness of breath. 06/01/19   [provider]  aspirin EC 325 MG tablet Take 325 mg by mouth every 6 (six) hours as needed for moderate pain.    [provider]  doxycycline (VIBRAMYCIN) 100 MG capsule Take 1 capsule (100 mg total) by mouth 2 (two) times daily. Patient not taking: No sig reported 06/19/18   Army Melia A, PA-C  escitalopram (LEXAPRO) 10 MG tablet Take 1 tablet by mouth daily. 08/31/20   [provider]  fluticasone (FLONASE) 50 MCG/ACT nasal spray Place 1 spray into both nostrils daily. 06/19/18   Jeannie Fend, PA-C  levocetirizine (XYZAL) 5 MG tablet Take 5 mg by mouth daily. 05/26/19   [provider]  Multiple Vitamin (MULTIVITAMIN ADULT) TABS Take 1 tablet by  mouth daily.    [provider]  Probiotic Product (PROBIOTIC PO) Take 1 capsule by mouth daily.    [provider]  TRI-LO-MARZIA 0.18/0.215/0.25 MG-25 MCG tab Take 1 tablet by mouth daily. 05/25/19   [provider]      Allergies    Cats claw (uncaria tomentosa), Dog epithelium allergy skin test, American cockroach allergy skin test, Dust mite extract, Grass extracts [gramineae pollens], Molds & smuts, Pollen extract, and Short ragweed pollen ext    Review of Systems   Review of Systems Negative except as per HPI Physical Exam Updated Vital Signs BP 136/85 (BP Location: Right Arm)   Pulse 88   Temp 99 F (37.2 C) (Oral)   Resp 17   SpO2 97%  Physical Exam Vitals and nursing note reviewed.  Constitutional:      General: She is not in acute distress.    Appearance: She is well-developed. She is not diaphoretic.  HENT:     Head: Normocephalic and atraumatic.  Cardiovascular:     Rate and Rhythm: Normal rate and regular rhythm.     Heart sounds: Normal heart sounds.  Pulmonary:     Effort: Pulmonary effort is normal.     Breath sounds: Normal breath sounds.  Musculoskeletal:     Right lower leg: No edema.     Left lower leg: No edema.  Skin:  General: Skin is warm and dry.     Findings: Rash present. No erythema.     Comments: Urticaria primarily to inner aspects of bilateral thighs tapering down towards lower extremities.  Scattered/2-3 lesions per upper extremity, linear excoriations to back.  Neurological:     Mental Status: She is alert and oriented to person, place, and time.  Psychiatric:        Behavior: Behavior normal.     ED Results / Procedures / Treatments   Labs (all labs ordered are listed, but only abnormal results are displayed) Labs Reviewed - No data to display  EKG None  Radiology No results found.  Procedures Procedures    Medications Ordered in ED Medications  methylPREDNISolone sodium succinate  (SOLU-MEDROL) 125 mg/2 mL injection 125 mg (has no administration in time range)  famotidine (PEPCID) tablet 20 mg (has no administration in time range)    ED Course/ Medical Decision Making/ A&P                           Medical Decision Making Risk Prescription drug management.   26 year old female with urticarial rash without source identified.  Did not improve with Benadryl taken earlier today at home.  No respiratory symptoms, no GI symptoms.  Lungs are clear to auscultation.  Plan is to treat with prednisone, recommend continue Benadryl, and Pepcid.  Discussed importance of follow-up with PCP, may be related episode however could be also related to allergen or idiopathic.        Final Clinical Impression(s) / ED Diagnoses Final diagnoses:  Urticaria    Rx / DC Orders ED Discharge Orders          Ordered    predniSONE (STERAPRED UNI-PAK 21 TAB) 10 MG (21) TBPK tablet  Daily        01/15/22 1346              Alden Hipp 01/15/22 1404    Cheryll Cockayne, MD 01/15/22 1408

## 2022-01-15 NOTE — ED Triage Notes (Addendum)
Patient complains of hives on her arms, legs, and back that she noticed at 0500 this morning. Patient is unsure of what she may be reacting to. Patient is alert, oriented, speaking in complete sentences, ambulatory, and in no apparent distress at this time. Patient took one 25 mg benadryl at approximately 0900 today.

## 2022-04-04 ENCOUNTER — Other Ambulatory Visit: Payer: Self-pay

## 2022-04-04 ENCOUNTER — Ambulatory Visit (HOSPITAL_COMMUNITY)
Admission: EM | Admit: 2022-04-04 | Discharge: 2022-04-04 | Disposition: A | Discharge status: Home / Self Care | Payer: No Typology Code available for payment source | Attending: Physician Assistant | Admitting: Physician Assistant

## 2022-04-04 ENCOUNTER — Encounter (HOSPITAL_COMMUNITY): Payer: Self-pay | Admitting: *Deleted

## 2022-04-04 DIAGNOSIS — W57XXXA Bitten or stung by nonvenomous insect and other nonvenomous arthropods, initial encounter: Secondary | ICD-10-CM

## 2022-04-04 DIAGNOSIS — S80861A Insect bite (nonvenomous), right lower leg, initial encounter: Secondary | ICD-10-CM

## 2022-04-04 MED ORDER — TRIAMCINOLONE ACETONIDE 0.1 % EX CREA: 1.0000 | TOPICAL_CREAM | Freq: Two times a day (BID) | CUTANEOUS | 0 refills | Status: AC

## 2022-04-04 MED ORDER — MUPIROCIN 2 % EX OINT: 1.0000 | TOPICAL_OINTMENT | Freq: Every day | CUTANEOUS | 0 refills | Status: AC

## 2022-04-04 NOTE — ED Triage Notes (Signed)
Pt has a possible insect bite to Rt lower posterior leg. Pt reports site feel hard .
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
## 2022-04-04 NOTE — Discharge Instructions (Signed)
I am not exactly sure what bit you.  I am glad it is improving.  Keep it elevated and use ice at night.  Keep it clean and apply Bactroban ointment daily.  If you have any bleeding, drainage, swelling, increased pain you need to be seen immediately.  Use Kenalog cream twice daily.  This should help with itching and irritation.  If anything changes or worsens you need to be seen immediately.

## 2022-09-08 ENCOUNTER — Emergency Department (HOSPITAL_COMMUNITY): Payer: No Typology Code available for payment source

## 2022-09-08 ENCOUNTER — Emergency Department (HOSPITAL_COMMUNITY)
Admission: EM | Admit: 2022-09-08 | Discharge: 2022-09-08 | Disposition: A | Discharge status: Home / Self Care | Payer: No Typology Code available for payment source | Attending: Emergency Medicine | Admitting: Emergency Medicine

## 2022-09-08 DIAGNOSIS — Z7951 Long term (current) use of inhaled steroids: Secondary | ICD-10-CM | POA: Insufficient documentation

## 2022-09-08 DIAGNOSIS — J45909 Unspecified asthma, uncomplicated: Secondary | ICD-10-CM | POA: Diagnosis not present

## 2022-09-08 DIAGNOSIS — Z1152 Encounter for screening for COVID-19: Secondary | ICD-10-CM | POA: Insufficient documentation

## 2022-09-08 DIAGNOSIS — D72829 Elevated white blood cell count, unspecified: Secondary | ICD-10-CM | POA: Diagnosis not present

## 2022-09-08 DIAGNOSIS — J069 Acute upper respiratory infection, unspecified: Secondary | ICD-10-CM

## 2022-09-08 DIAGNOSIS — Z7982 Long term (current) use of aspirin: Secondary | ICD-10-CM | POA: Insufficient documentation

## 2022-09-08 DIAGNOSIS — R059 Cough, unspecified: Secondary | ICD-10-CM | POA: Diagnosis present

## 2022-09-08 LAB — CBC WITH DIFFERENTIAL/PLATELET
Abs Immature Granulocytes: 0.05 10*3/uL (ref 0.00–0.07)
Basophils Absolute: 0 10*3/uL (ref 0.0–0.1)
Basophils Relative: 0 %
Eosinophils Absolute: 0.1 10*3/uL (ref 0.0–0.5)
Eosinophils Relative: 1 %
HCT: 37.6 % (ref 36.0–46.0)
Hemoglobin: 12.5 g/dL (ref 12.0–15.0)
Immature Granulocytes: 0 %
Lymphocytes Relative: 23 %
Lymphs Abs: 2.7 10*3/uL (ref 0.7–4.0)
MCH: 28.9 pg (ref 26.0–34.0)
MCHC: 33.2 g/dL (ref 30.0–36.0)
MCV: 86.8 fL (ref 80.0–100.0)
Monocytes Absolute: 0.8 10*3/uL (ref 0.1–1.0)
Monocytes Relative: 7 %
Neutro Abs: 8 10*3/uL — ABNORMAL HIGH (ref 1.7–7.7)
Neutrophils Relative %: 69 %
Platelets: 250 10*3/uL (ref 150–400)
RBC: 4.33 MIL/uL (ref 3.87–5.11)
RDW: 12.5 % (ref 11.5–15.5)
WBC: 11.7 10*3/uL — ABNORMAL HIGH (ref 4.0–10.5)
nRBC: 0 % (ref 0.0–0.2)

## 2022-09-08 LAB — RESP PANEL BY RT-PCR (RSV, FLU A&B, COVID)  RVPGX2
Influenza A by PCR: NEGATIVE
Influenza B by PCR: NEGATIVE
Resp Syncytial Virus by PCR: NEGATIVE
SARS Coronavirus 2 by RT PCR: NEGATIVE

## 2022-09-08 LAB — TROPONIN I (HIGH SENSITIVITY)
Troponin I (High Sensitivity): 3 ng/L (ref <18)
Troponin I (High Sensitivity): 4 ng/L (ref <18)

## 2022-09-08 LAB — BASIC METABOLIC PANEL
Anion gap: 7 (ref 5–15)
BUN: <5 mg/dL — ABNORMAL LOW (ref 6–20)
CO2: 27 mmol/L (ref 22–32)
Calcium: 9.3 mg/dL (ref 8.9–10.3)
Chloride: 103 mmol/L (ref 98–111)
Creatinine, Ser: 0.76 mg/dL (ref 0.44–1.00)
GFR, Estimated: >60 mL/min (ref >60)
Glucose, Bld: 91 mg/dL (ref 70–99)
Potassium: 4.1 mmol/L (ref 3.5–5.1)
Sodium: 137 mmol/L (ref 135–145)

## 2022-09-08 MED ORDER — IBUPROFEN 400 MG PO TABS
400.0000 mg | ORAL_TABLET | Freq: Once | ORAL | Status: AC
  Administered 2022-09-08: 400 mg via ORAL
  Filled 2022-09-08: qty 1

## 2022-09-08 NOTE — ED Triage Notes (Signed)
Pt c/o URI symptoms since 2/17 including chills, cough, sore throat, fatigue, poor PO intake, congestion. Pt also c/o CP and SOB.
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
## 2022-09-08 NOTE — Discharge Instructions (Addendum)
You were evaluated in the emergency department, all of your labs looked very good and your chest x-ray did not show a pneumonia.  Your white blood cell count was slightly elevated which can be seen in a viral infection.  As we discussed you are having a sore throat however you are still able to eat and drink with some discomfort.  At this point we cannot rule out a deep bacterial infection however you do not appear to have this on presentation.  Please look out for the symptoms that we discussed such as inability to tolerate any oral intake, difficulty breathing, cannot tolerate your own saliva, or inability to lie flat.  If you have any of these concerns or other emergent concerns please return to the ED for evaluation.  Continue Motrin and Tylenol for fevers, pain and discomfort.  Please follow-up with your PCP in 2 to 3 days for reevaluation.

## 2022-09-08 NOTE — ED Provider Triage Note (Signed)
Emergency Medicine Provider Triage Evaluation Note  Shelby Burke , a 27 y.o. female  was evaluated in triage.  Pt complains of URI symptoms, fever, chest pain as of today.  Review of Systems  Positive: As above Negative: As above  Physical Exam  BP (!) 117/93 (BP Location: Right Arm) Comment: Simultaneous filing. User may not have seen previous data. Comment (BP Location): Simultaneous filing. User may not have seen previous data.  Pulse 79 Comment: Simultaneous filing. User may not have seen previous data.  Temp 98.2 F (36.8 C) (Oral) Comment: Simultaneous filing. User may not have seen previous data. Comment (Src): Simultaneous filing. User may not have seen previous data.  Resp 20 Comment: Simultaneous filing. User may not have seen previous data.  LMP  (Within Days)   SpO2 97% Comment: Simultaneous filing. User may not have seen previous data. Gen:   Awake, no distress   Resp:  Normal effort  MSK:   Moves extremities without difficulty  Other:    Medical Decision Making  Medically screening exam initiated at 3:38 PM.  Appropriate orders placed.  Ellianah Bridge was informed that the remainder of the evaluation will be completed by another provider, this initial triage assessment does not replace that evaluation, and the importance of remaining in the ED until their evaluation is complete.     Evlyn Courier, PA-C 09/08/22 1539

## 2023-03-03 ENCOUNTER — Ambulatory Visit (HOSPITAL_COMMUNITY)
Admission: EM | Admit: 2023-03-03 | Discharge: 2023-03-03 | Disposition: A | Discharge status: Home / Self Care | Payer: No Typology Code available for payment source | Attending: Emergency Medicine | Admitting: Emergency Medicine

## 2023-03-03 ENCOUNTER — Encounter (HOSPITAL_COMMUNITY): Payer: Self-pay

## 2023-03-03 DIAGNOSIS — N898 Other specified noninflammatory disorders of vagina: Secondary | ICD-10-CM | POA: Insufficient documentation

## 2023-03-03 DIAGNOSIS — Z113 Encounter for screening for infections with a predominantly sexual mode of transmission: Secondary | ICD-10-CM | POA: Insufficient documentation

## 2023-03-03 LAB — HIV ANTIBODY (ROUTINE TESTING W REFLEX): HIV Screen 4th Generation wRfx: NONREACTIVE

## 2023-03-03 NOTE — ED Triage Notes (Signed)
Pt would like STD-testing. Denies any symptoms at this time.

## 2023-03-03 NOTE — Discharge Instructions (Signed)
Give [place] a working phone number so that we can contact you if needed. Refrain from sexual contact until all of your labs have come back, symptoms have resolved, and your partner(s) are treated if necessary.   Go to www.goodrx.com  or www.costplusdrugs.com to look up your medications. This will give you a list of where you can find your prescriptions at the most affordable prices. Or ask the pharmacist what the cash price is, or if they have any other discount programs available to help make your medication more affordable. This can be less expensive than what you would pay with insurance.

## 2023-03-03 NOTE — ED Provider Notes (Signed)
HPI  SUBJECTIVE:  Shelby Burke is a 27 y.o. female who presents for STD screening.  She reports yellow discharge, but states that this is not really new or different.  No vaginal odor, bleeding, itching.  No urinary complaints.  No nausea, vomiting, fevers, abdominal, back, pelvic pain.  She is sexually active with a new female partner, who is asymptomatic.  She has 2 other female partners, both of whom are asymptomatic.  She is not sure if any of her partners have other partners.  No aggravating or alleviating factors.  She has not tried anything for this.  She has a past medical history of chlamydia, trichomonas, BV and yeast.  No history of diabetes.  LMP: August 10.  Denies the possibility of being pregnant.  PCP: [facility]    Past Medical History:  Diagnosis Date   Asthma    Overweight (BMI 25.0-29.9)    Urticaria     Past Surgical History:  Procedure Laterality Date   ADENOIDECTOMY     BREAST REDUCTION SURGERY  2019    Family History  Problem Relation Age of Onset   Asthma Mother    Allergic rhinitis Mother    Allergic rhinitis Father     Social History   Tobacco Use   Smoking status: Some Days   Smokeless tobacco: Never  Vaping Use   Vaping status: Never Used  Substance Use Topics   Alcohol use: Yes    Comment: once or twice a month   Drug use: No    No current facility-administered medications for this encounter.  Current Outpatient Medications:    escitalopram (LEXAPRO) 10 MG tablet, Take 1 tablet by mouth daily., Disp: , Rfl:    levocetirizine (XYZAL) 5 MG tablet, Take 5 mg by mouth daily., Disp: , Rfl:    Probiotic Product (PROBIOTIC PO), Take 1 capsule by mouth daily., Disp: , Rfl:    albuterol (VENTOLIN HFA) 108 (90 Base) MCG/ACT inhaler, Inhale 2 puffs into the lungs every 6 (six) hours as needed for wheezing or shortness of breath., Disp: , Rfl:    aspirin EC 325 MG tablet, Take 325 mg by mouth every 6 (six) hours as needed for moderate pain.,  Disp: , Rfl:    fluticasone (FLONASE) 50 MCG/ACT nasal spray, Place 1 spray into both nostrils daily., Disp: 16 g, Rfl: 2   Multiple Vitamin (MULTIVITAMIN ADULT) TABS, Take 1 tablet by mouth daily., Disp: , Rfl:    mupirocin ointment (BACTROBAN) 2 %, Apply 1 Application topically daily., Disp: 22 g, Rfl: 0   TRI-LO-MARZIA 0.18/0.215/0.25 MG-25 MCG tab, Take 1 tablet by mouth daily., Disp: , Rfl:    triamcinolone cream (KENALOG) 0.1 %, Apply 1 Application topically 2 (two) times daily., Disp: 30 g, Rfl: 0  Allergies  Allergen Reactions   Cats Claw (Uncaria Tomentosa) Other (See Comments)   Dog Epithelium (Canis Lupus Familiaris) Other (See Comments)   American Cockroach Itching and Rash   Dust Mite Extract Itching and Rash   Grass Extracts [Gramineae Pollens] Itching and Rash   Molds & Smuts Itching and Rash   Pollen Extract Itching and Rash   Short Ragweed Pollen Ext Itching and Rash     ROS  As noted in HPI.   Physical Exam  BP 122/66 (BP Location: Left Arm)   Pulse 73   Temp 98.7 F (37.1 C) (Oral)   Resp 16   LMP 02/25/2023   SpO2 98%   Constitutional: Well developed, well nourished, no acute  distress Eyes:  EOMI, conjunctiva normal bilaterally HENT: Normocephalic, atraumatic,mucus membranes moist Respiratory: Normal inspiratory effort Cardiovascular: Normal rate GI: nondistended.  Soft, nontender.  No lower quadrant, suprapubic, flank tenderness Back: No CVAT GU: Deferred skin: No rash, skin intact Musculoskeletal: no deformities Neurologic: Alert & oriented x 3, no focal neuro deficits Psychiatric: Speech and behavior appropriate   ED Course   Medications - No data to display  Orders Placed This Encounter  Procedures   HIV Antibody (routine testing w rflx)    Standing Status:   Standing    Number of Occurrences:   1   RPR    Standing Status:   Standing    Number of Occurrences:   1    No results found for this or any previous visit (from the past 24  hour(s)). No results found.  ED Clinical Impression  1. Vaginal discharge   2. Screen for STD (sexually transmitted disease)      ED Assessment/Plan     Checking gonorrhea, chlamydia, trichomonas, BV, yeast, HIV, RPR.  Advised patient to refrain from intercourse until all of her labs have been resulted and she and her partners have been treated if necessary.  Will base treatment off of labs.  She provided a working phone number.  Follow-up with PCP as needed.  No orders of the defined types were placed in this encounter.     *This clinic note was created using Dragon dictation software. Therefore, there may be occasional mistakes despite careful proofreading.  ?    Domenick Gong, MD 03/03/23 1541

## 2023-03-04 LAB — RPR: RPR Ser Ql: NONREACTIVE

## 2023-03-06 ENCOUNTER — Telehealth: Payer: Self-pay | Admitting: Emergency Medicine

## 2023-03-06 LAB — CERVICOVAGINAL ANCILLARY ONLY
Bacterial Vaginitis (gardnerella): POSITIVE — AB
Candida Glabrata: NEGATIVE
Candida Vaginitis: NEGATIVE
Chlamydia: NEGATIVE
Comment: NEGATIVE
Comment: NEGATIVE
Comment: NEGATIVE
Comment: NEGATIVE
Comment: NEGATIVE
Comment: NORMAL
Neisseria Gonorrhea: NEGATIVE
Trichomonas: NEGATIVE

## 2023-03-06 MED ORDER — METRONIDAZOLE 500 MG PO TABS: 500.0000 mg | ORAL_TABLET | Freq: Two times a day (BID) | ORAL | 0 refills | Status: AC

## 2023-04-24 ENCOUNTER — Encounter (HOSPITAL_COMMUNITY): Payer: Self-pay

## 2023-04-24 ENCOUNTER — Ambulatory Visit (HOSPITAL_COMMUNITY)
Admission: RE | Admit: 2023-04-24 | Discharge: 2023-04-24 | Disposition: A | Discharge status: Home / Self Care | Payer: No Typology Code available for payment source | Source: Ambulatory Visit | Attending: Internal Medicine | Admitting: Internal Medicine

## 2023-04-24 ENCOUNTER — Other Ambulatory Visit: Payer: Self-pay

## 2023-04-24 VITALS — BP 125/90 | HR 72 | Temp 98.7°F | Resp 14

## 2023-04-24 DIAGNOSIS — N939 Abnormal uterine and vaginal bleeding, unspecified: Secondary | ICD-10-CM | POA: Insufficient documentation

## 2023-04-24 DIAGNOSIS — O039 Complete or unspecified spontaneous abortion without complication: Secondary | ICD-10-CM | POA: Diagnosis present

## 2023-04-24 DIAGNOSIS — Z3202 Encounter for pregnancy test, result negative: Secondary | ICD-10-CM | POA: Diagnosis not present

## 2023-04-24 LAB — CBC
HCT: 39.5 % (ref 36.0–46.0)
Hemoglobin: 13.1 g/dL (ref 12.0–15.0)
MCH: 29.4 pg (ref 26.0–34.0)
MCHC: 33.2 g/dL (ref 30.0–36.0)
MCV: 88.6 fL (ref 80.0–100.0)
Platelets: 136 10*3/uL — ABNORMAL LOW (ref 150–400)
RBC: 4.46 MIL/uL (ref 3.87–5.11)
RDW: 13 % (ref 11.5–15.5)
WBC: 9.4 10*3/uL (ref 4.0–10.5)
nRBC: 0 % (ref 0.0–0.2)

## 2023-04-24 LAB — POCT URINE PREGNANCY: Preg Test, Ur: NEGATIVE

## 2023-04-24 NOTE — ED Triage Notes (Signed)
Patient states that she is 3-[redacted] weeks pregnant and noticed "a lot" of dark red vaginal bleeding yesterday with cramping. States she had a positive pregnancy test on 04/20/23.

## 2023-04-24 NOTE — Discharge Instructions (Addendum)
I suspect you had a miscarriage.  Your pregnancy test here is negative. I have checked blood work to make sure that you have not lost too much blood related to the miscarriage causing anemia. Staff will call you if your blood work is abnormal. Please follow-up with your OB/GYN.   If you have persistent vaginal bleeding, severe abdominal pain, severe low back pain, or fever/chills, please go to the emergency room.

## 2023-04-24 NOTE — ED Provider Notes (Signed)
[facility]    CSN: [id number] Arrival date & time: 04/24/23  1624      History   Chief Complaint Chief Complaint  Patient presents with   Vaginal Bleeding    3-[redacted] weeks pregnant. Started experiencing heavy bleeding 10/6. - Entered by patient    HPI Shelby Burke is a 27 y.o. female.   Shelby Burke is a 27 y.o. female presenting for chief complaint of vaginal bleeding that started yesterday.  Patient took a pregnancy test 2 weeks ago and it was positive.  Her last menstrual cycle was March 19, 2023.  Until yesterday, she had not had any pregnancy related problems.  Vaginal bleeding that started yesterday is described as heavy with thick clots.  She states bleeding was more significant than her normal menstrual cycles and reports associated abdominal cramping and low back pain.  Vaginal bleeding has resolved and so has the low back pain/abdominal cramping.  She has remained afebrile throughout vaginal bleeding course and denies heart palpitations, dizziness, syncope, chest pain, shortness of breath, and fatigue.  No history of iron deficiency anemia.  This was her first pregnancy.  She is concerned that she may have miscarried.  Miscarriages run in her family.  No urinary symptoms, dizziness, vaginal symptoms, nausea, vomiting, diarrhea.  Taking over-the-counter medications to help with abdominal cramping with relief.  She has no OB/GYN but has not establish care with them for this pregnancy yet.   Vaginal Bleeding   Past Medical History:  Diagnosis Date   Asthma    Overweight (BMI 25.0-29.9)    Urticaria     Patient Active Problem List   Diagnosis Date Noted   Overweight (BMI 25.0-29.9)    Other allergic rhinitis 07/31/2019   Frequent sinus infections 07/31/2019   Pruritic rash 07/31/2019   Mild intermittent asthma without complication 07/31/2019    Past Surgical History:  Procedure Laterality Date   ADENOIDECTOMY     BREAST REDUCTION SURGERY  2019     OB History     Gravida  1   Para      Term      Preterm      AB  1   Living         SAB  1   IAB      Ectopic      Multiple      Live Births               Home Medications    Prior to Admission medications   Medication Sig Start Date End Date Taking? Authorizing Provider  albuterol (VENTOLIN HFA) 108 (90 Base) MCG/ACT inhaler Inhale 2 puffs into the lungs every 6 (six) hours as needed for wheezing or shortness of breath. 06/01/19   [provider]  aspirin EC 325 MG tablet Take 325 mg by mouth every 6 (six) hours as needed for moderate pain.    [provider]  escitalopram (LEXAPRO) 10 MG tablet Take 1 tablet by mouth daily. 08/31/20   [provider]  fluticasone (FLONASE) 50 MCG/ACT nasal spray Place 1 spray into both nostrils daily. 06/19/18   Jeannie Fend, PA-C  levocetirizine (XYZAL) 5 MG tablet Take 5 mg by mouth daily. 05/26/19   [provider]  metroNIDAZOLE (FLAGYL) 500 MG tablet Take 1 tablet (500 mg total) by mouth 2 (two) times daily. 03/06/23   Merrilee Jansky, MD  Multiple Vitamin (MULTIVITAMIN ADULT) TABS Take 1 tablet by mouth daily.  [provider]  mupirocin ointment (BACTROBAN) 2 % Apply 1 Application topically daily. 04/04/22   Raspet, Noberto Retort, PA-C  Probiotic Product (PROBIOTIC PO) Take 1 capsule by mouth daily.    [provider]  TRI-LO-MARZIA 0.18/0.215/0.25 MG-25 MCG tab Take 1 tablet by mouth daily. 05/25/19   [provider]  triamcinolone cream (KENALOG) 0.1 % Apply 1 Application topically 2 (two) times daily. 04/04/22   Raspet, Noberto Retort, PA-C    Family History Family History  Problem Relation Age of Onset   Asthma Mother    Allergic rhinitis Mother    Allergic rhinitis Father     Social History Social History   Tobacco Use   Smoking status: Some Days   Smokeless tobacco: Never  Vaping Use   Vaping status: Never Used  Substance Use Topics   Alcohol use:  Yes    Comment: once or twice a month   Drug use: No     Allergies   Cats claw (uncaria tomentosa), Dog epithelium (canis lupus familiaris), American cockroach, Dust mite extract, Grass extracts [gramineae pollens], Molds & smuts, Pollen extract, and Short ragweed pollen ext   Review of Systems Review of Systems  Genitourinary:  Positive for vaginal bleeding.  Her HPI   Physical Exam Triage Vital Signs ED Triage Vitals  Encounter Vitals Group     BP 04/24/23 1728 (!) 125/90     Systolic BP Percentile --      Diastolic BP Percentile --      Pulse Rate 04/24/23 1728 72     Resp 04/24/23 1728 14     Temp 04/24/23 1728 98.7 F (37.1 C)     Temp Source 04/24/23 1728 Oral     SpO2 04/24/23 1728 97 %     Weight --      Height --      Head Circumference --      Peak Flow --      Pain Score 04/24/23 1726 0     Pain Loc --      Pain Education --      Exclude from Growth Chart --    No data found.  Updated Vital Signs BP (!) 125/90 (BP Location: Left Arm)   Pulse 72   Temp 98.7 F (37.1 C) (Oral)   Resp 14   SpO2 97%   Visual Acuity Right Eye Distance:   Left Eye Distance:   Bilateral Distance:    Right Eye Near:   Left Eye Near:    Bilateral Near:     Physical Exam Vitals and nursing note reviewed.  Constitutional:      Appearance: She is not ill-appearing or toxic-appearing.  HENT:     Head: Normocephalic and atraumatic.     Right Ear: Hearing and external ear normal.     Left Ear: Hearing and external ear normal.     Nose: Nose normal.     Mouth/Throat:     Lips: Pink.  Eyes:     General: Lids are normal. Vision grossly intact. Gaze aligned appropriately.     Extraocular Movements: Extraocular movements intact.     Conjunctiva/sclera: Conjunctivae normal.  Cardiovascular:     Rate and Rhythm: Normal rate and regular rhythm.     Heart sounds: Normal heart sounds, S1 normal and S2 normal.  Pulmonary:     Effort: Pulmonary effort is normal. No  respiratory distress.     Breath sounds: Normal breath sounds and air entry.  Musculoskeletal:  Cervical back: Neck supple.  Skin:    General: Skin is warm and dry.     Capillary Refill: Capillary refill takes less than 2 seconds.     Findings: No rash.  Neurological:     General: No focal deficit present.     Mental Status: She is alert and oriented to person, place, and time. Mental status is at baseline.     Cranial Nerves: No dysarthria or facial asymmetry.  Psychiatric:        Mood and Affect: Mood normal.        Speech: Speech normal.        Behavior: Behavior normal.        Thought Content: Thought content normal.        Judgment: Judgment normal.      UC Treatments / Results  Labs (all labs ordered are listed, but only abnormal results are displayed) Labs Reviewed  CBC  POCT URINE PREGNANCY    EKG   Radiology No results found.  Procedures Procedures (including critical care time)  Medications Ordered in UC Medications - No data to display  Initial Impression / Assessment and Plan / UC Course  I have reviewed the triage vital signs and the nursing notes.  Pertinent labs & imaging results that were available during my care of the patient were reviewed by me and considered in my medical decision making (see chart for details).   1.  Spontaneous miscarriage, vaginal bleeding, negative pregnancy test Suspect spontaneous miscarriage.  Updated patient's pregnancy status. hCG level is low enough to be undetected and urine pregnancy. CBC pending to rule out anemia related to recent vaginal bleeding.  Patient is established with [facility] physicians OB/GYN and may schedule a follow-up appointment as needed.  Emergency department precautions discussed should she develop new or worsening severe low back/abdominal pain, further vaginal bleeding, fever/chills, or dizziness/syncope. She is agreeable with this plan.  Well-appearing with hemodynamically stable vital signs.   Low suspicion for ectopic pregnancy, therefore deferred immediate referral to [facility].  Counseled patient on potential for adverse effects with medications prescribed/recommended today, strict ER and return-to-clinic precautions discussed, patient verbalized understanding.    Final Clinical Impressions(s) / UC Diagnoses   Final diagnoses:  Spontaneous miscarriage  Vaginal bleeding  Negative pregnancy test     Discharge Instructions      I suspect you had a miscarriage.  Your pregnancy test here is negative. I have checked blood work to make sure that you have not lost too much blood related to the miscarriage causing anemia. Staff will call you if your blood work is abnormal. Please follow-up with your OB/GYN.   If you have persistent vaginal bleeding, severe abdominal pain, severe low back pain, or fever/chills, please go to the emergency room.     ED Prescriptions   None    PDMP not reviewed this encounter.   Carlisle Beers, [place] 04/24/23 1851

## 2023-05-11 NOTE — Plan of Care (Signed)
[facility] Tonsillectomy/Adenoidectomy, Postoperative PEDS care plan entered in error.

## 2023-07-14 ENCOUNTER — Other Ambulatory Visit: Payer: Self-pay | Admitting: Medical Genetics

## 2023-07-25 ENCOUNTER — Other Ambulatory Visit (HOSPITAL_COMMUNITY)
Admission: RE | Admit: 2023-07-25 | Discharge: 2023-07-25 | Disposition: A | Discharge status: Home / Self Care | Payer: Self-pay | Source: Ambulatory Visit | Attending: Oncology | Admitting: Oncology

## 2023-08-07 LAB — GENECONNECT MOLECULAR SCREEN: Genetic Analysis Overall Interpretation: NEGATIVE
